# Patient Record
Sex: Female | Born: 1970 | Race: White | Hispanic: No | Marital: Single | Smoking: Never smoker
Health system: Southern US, Community
[De-identification: ages and names within clinical notes are randomized; demographics above are authoritative.]

## PROBLEM LIST (undated history)

## (undated) DIAGNOSIS — F419 Anxiety disorder, unspecified: Secondary | ICD-10-CM

## (undated) DIAGNOSIS — F4310 Post-traumatic stress disorder, unspecified: Secondary | ICD-10-CM

## (undated) DIAGNOSIS — F909 Attention-deficit hyperactivity disorder, unspecified type: Secondary | ICD-10-CM

## (undated) DIAGNOSIS — F509 Eating disorder, unspecified: Secondary | ICD-10-CM

## (undated) DIAGNOSIS — E669 Obesity, unspecified: Secondary | ICD-10-CM

## (undated) HISTORY — DX: Post-traumatic stress disorder, unspecified: F43.10

## (undated) HISTORY — DX: Eating disorder, unspecified: F50.9

## (undated) HISTORY — DX: Anxiety disorder, unspecified: F41.9

## (undated) HISTORY — DX: Attention-deficit hyperactivity disorder, unspecified type: F90.9

## (undated) HISTORY — DX: Obesity, unspecified: E66.9

## (undated) HISTORY — PX: OTHER SURGICAL HISTORY: SHX169

---
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
## 1992-06-01 HISTORY — PX: BREAST REDUCTION SURGERY: SHX8

## 1999-11-04 ENCOUNTER — Emergency Department (HOSPITAL_COMMUNITY): Admission: EM | Admit: 1999-11-04 | Discharge: 1999-11-04 | Payer: Self-pay | Admitting: Emergency Medicine

## 1999-11-04 ENCOUNTER — Encounter: Payer: Self-pay | Admitting: Emergency Medicine

## 2002-07-29 ENCOUNTER — Encounter: Payer: Self-pay | Admitting: Emergency Medicine

## 2002-07-29 ENCOUNTER — Emergency Department (HOSPITAL_COMMUNITY): Admission: EM | Admit: 2002-07-29 | Discharge: 2002-07-29 | Payer: Self-pay | Admitting: Emergency Medicine

## 2004-04-18 ENCOUNTER — Emergency Department (HOSPITAL_COMMUNITY): Admission: EM | Admit: 2004-04-18 | Discharge: 2004-04-18 | Payer: Self-pay | Admitting: Family Medicine

## 2004-06-03 ENCOUNTER — Inpatient Hospital Stay (HOSPITAL_COMMUNITY): Admission: AD | Admit: 2004-06-03 | Discharge: 2004-06-03 | Payer: Self-pay | Admitting: Family Medicine

## 2004-06-12 ENCOUNTER — Other Ambulatory Visit: Admission: RE | Admit: 2004-06-12 | Discharge: 2004-06-12 | Payer: Self-pay | Admitting: Obstetrics and Gynecology

## 2004-08-10 ENCOUNTER — Emergency Department (HOSPITAL_COMMUNITY): Admission: EM | Admit: 2004-08-10 | Discharge: 2004-08-10 | Payer: Self-pay | Admitting: Emergency Medicine

## 2006-07-07 ENCOUNTER — Other Ambulatory Visit: Admission: RE | Admit: 2006-07-07 | Discharge: 2006-07-07 | Payer: Self-pay | Admitting: Family Medicine

## 2007-11-16 ENCOUNTER — Encounter: Admission: RE | Admit: 2007-11-16 | Discharge: 2007-11-16 | Payer: Self-pay | Admitting: Family Medicine

## 2009-04-16 IMAGING — CR DG SACRUM/COCCYX 2+V
4 series · 4 of 4 positions shown · non-contrast
Comparison: None available

CLINICAL DATA: Low back pain.  Tail bone pain.

SACRUM AND COCCYX - 2+ VIEW

[view not recorded (1 of 4)]
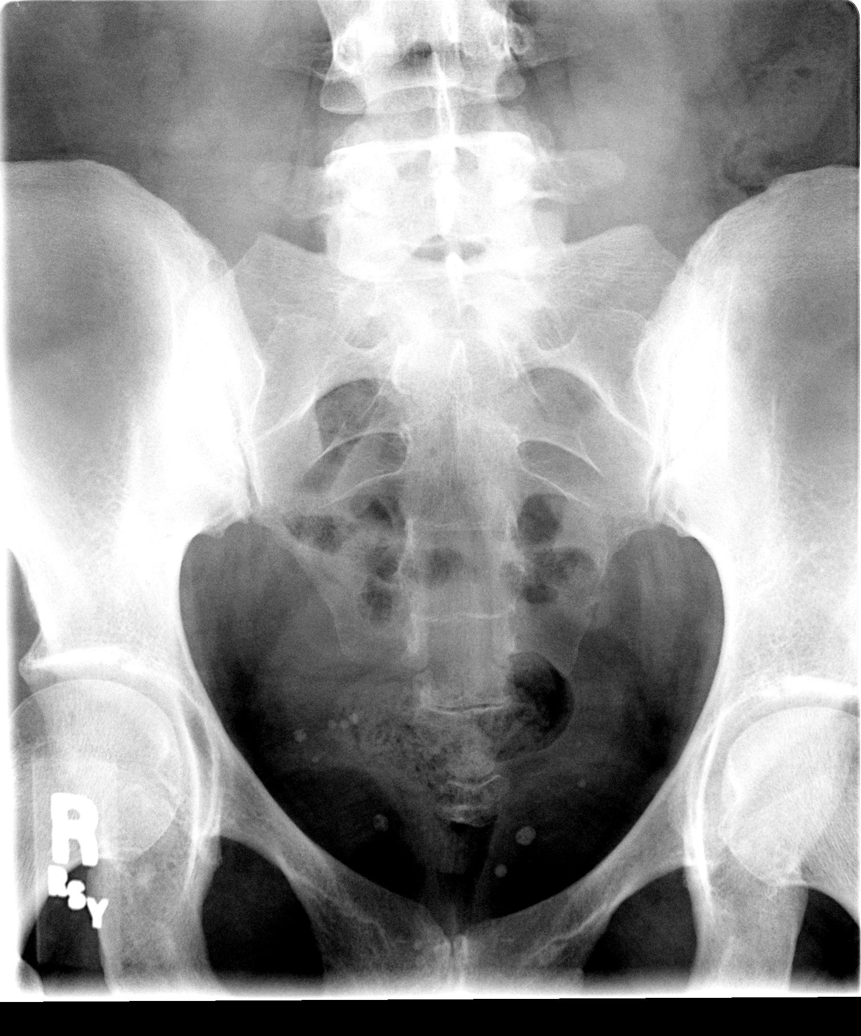

[view not recorded (2 of 4)]
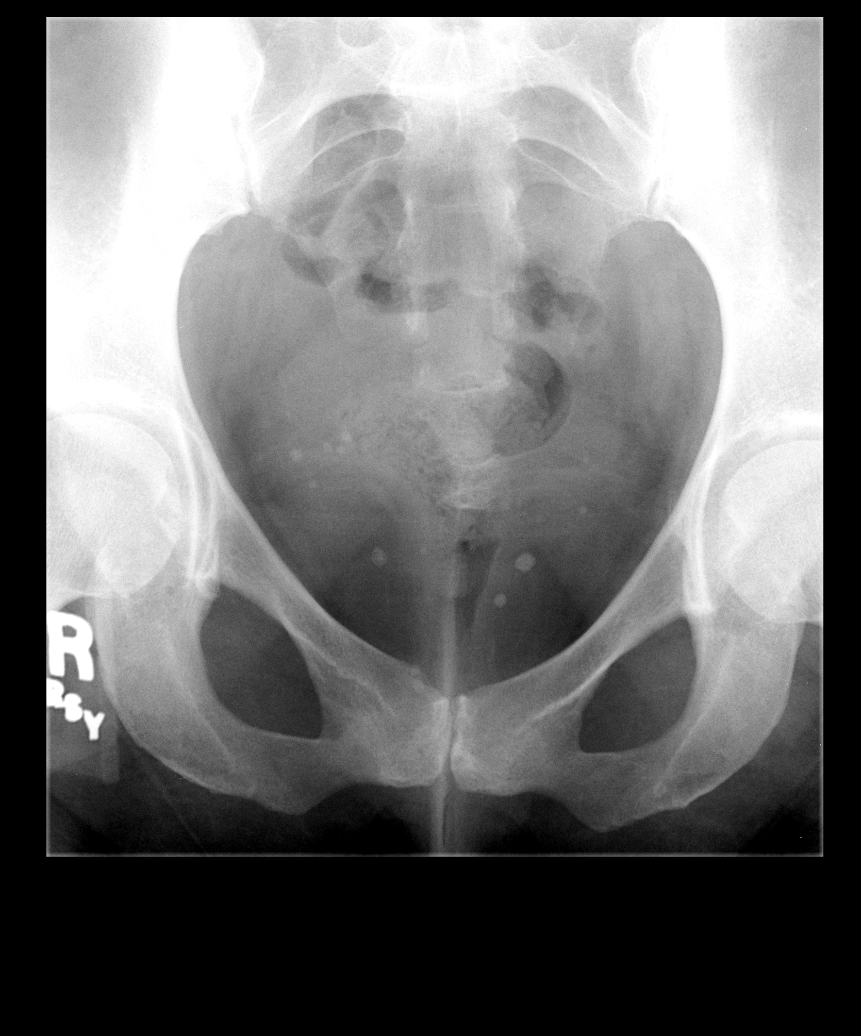

[view not recorded (3 of 4)]
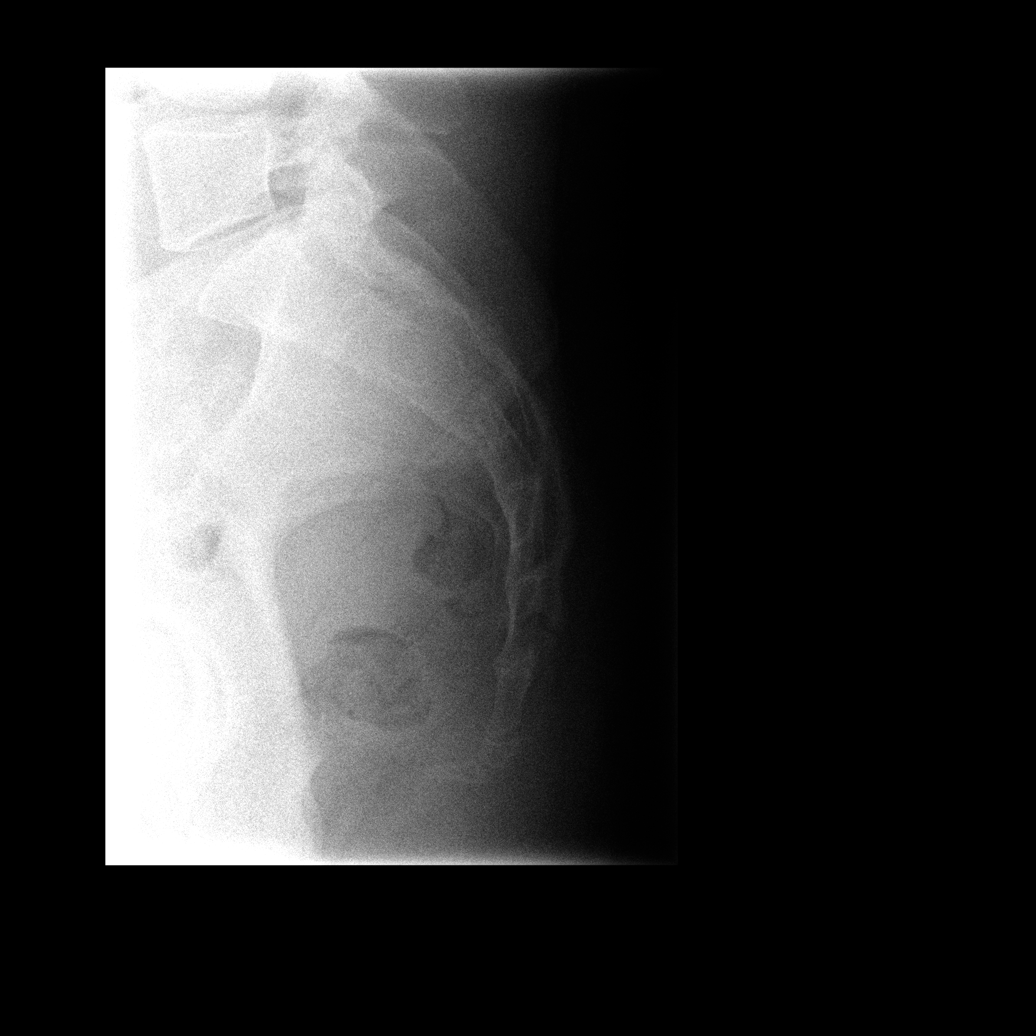

[view not recorded (4 of 4)]
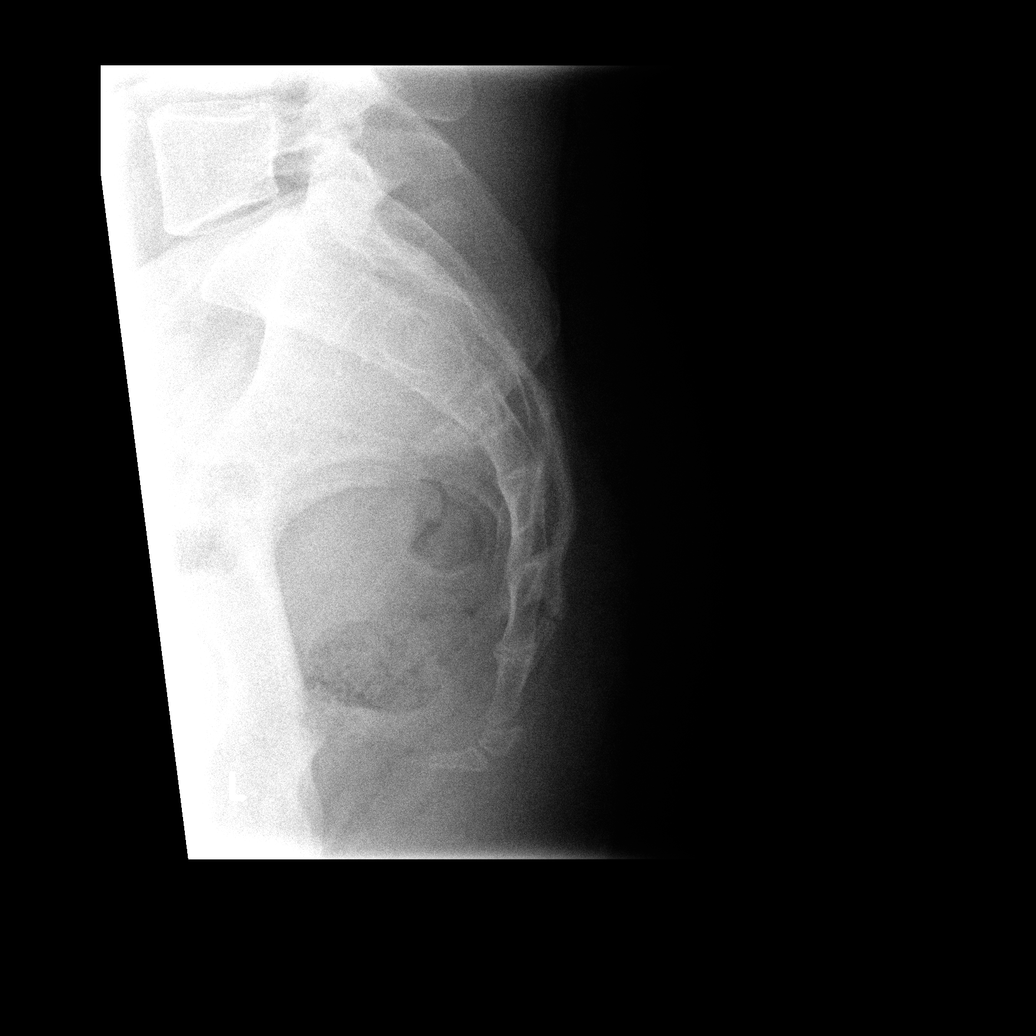

[4 of 4 positions shown; findings below may reference images not displayed]

FINDINGS: Sacrum and coccyx appear within normal limits.  No
evidence of sacral or coccygeal fracture.  Mild sclerosis of the
pubic symphysis, commonly seen in women, particularly after
childbearing.  SI joints appear within normal limits bilaterally.
IMPRESSION: Negative sacrum and coccyx radiographs.

## 2009-04-16 IMAGING — CR DG LUMBAR SPINE COMPLETE 4+V
5 series · 5 of 5 positions shown · non-contrast
Comparison: Sacrum and coccyx done same day.

CLINICAL DATA: Low back pain.  Tail bone pain.

LUMBAR SPINE - COMPLETE 4+ VIEW

[view not recorded (1 of 5)]
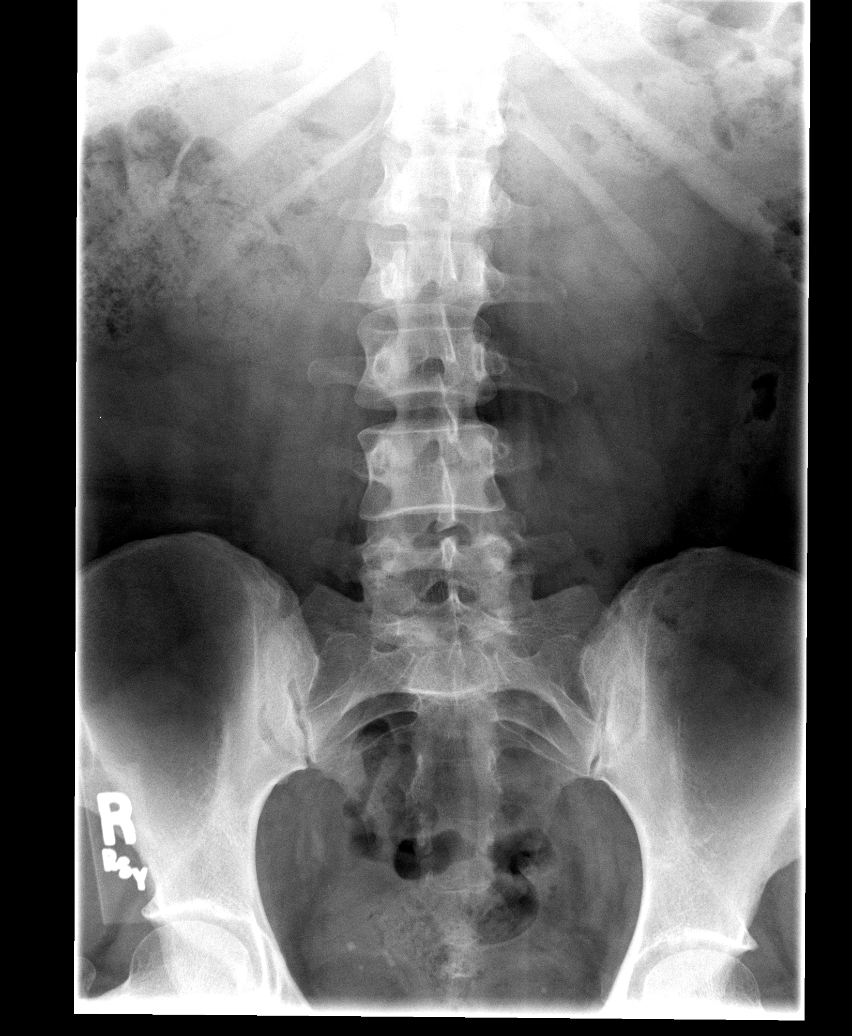

[view not recorded (2 of 5)]
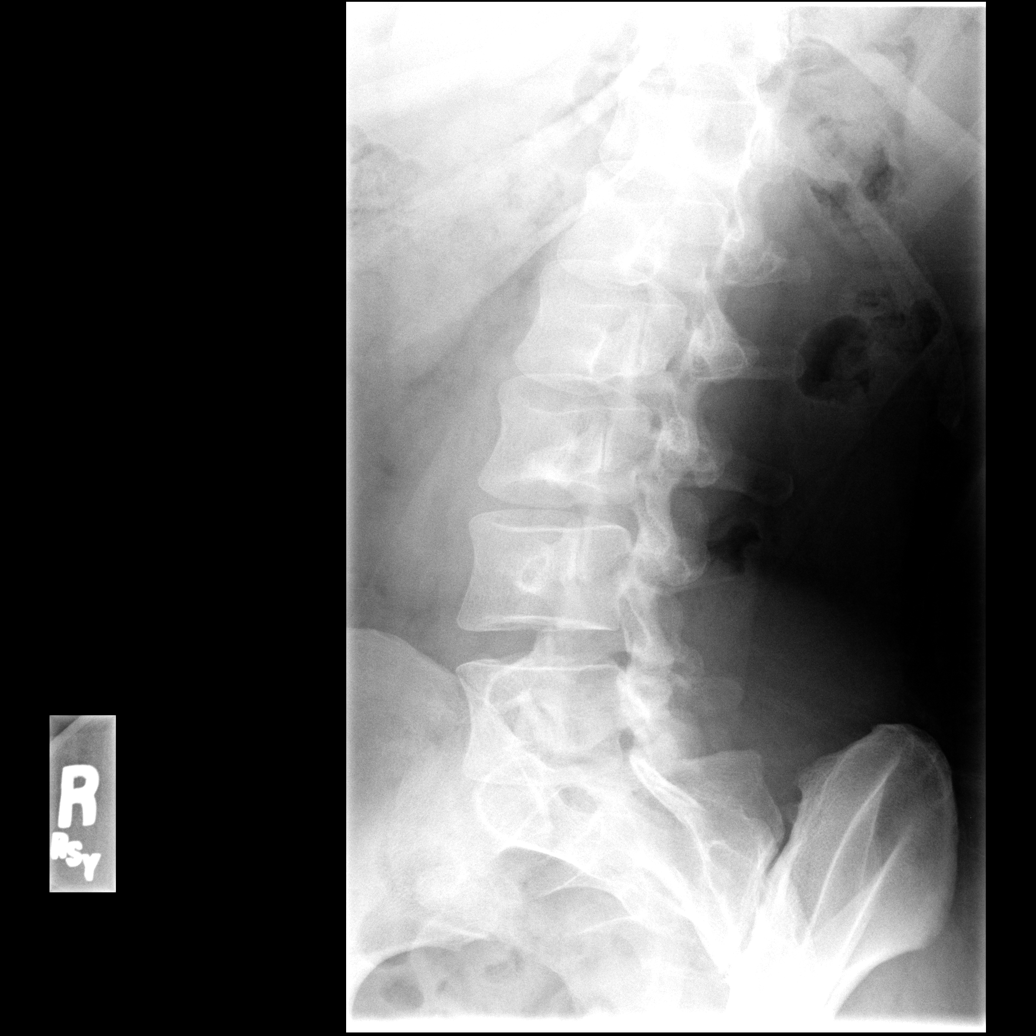

[view not recorded (3 of 5)]
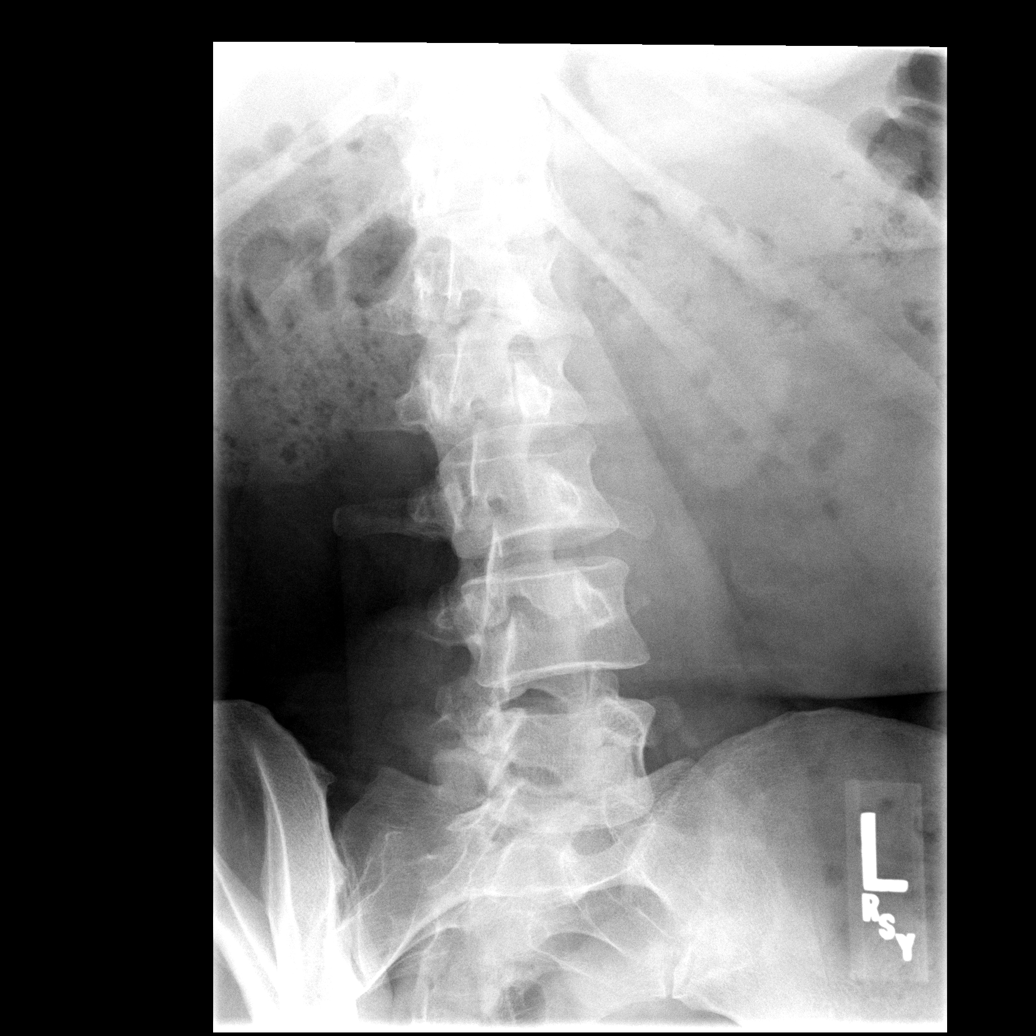

[view not recorded (4 of 5)]
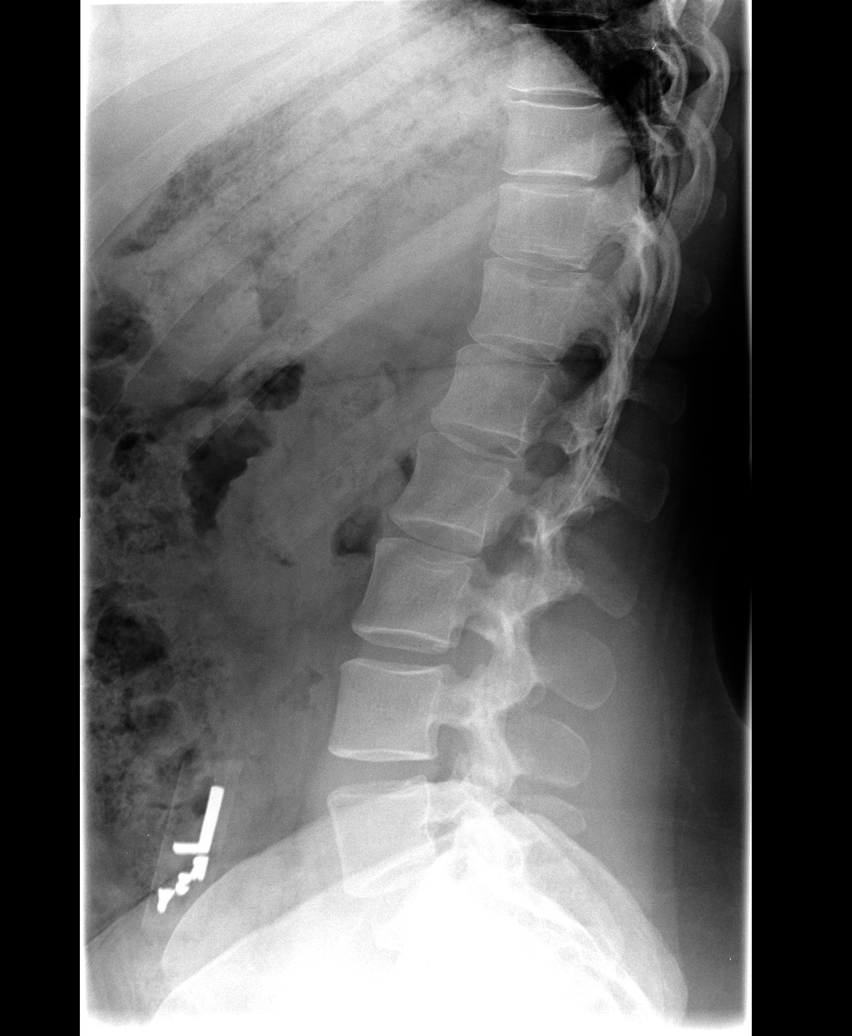

[view not recorded (5 of 5)]
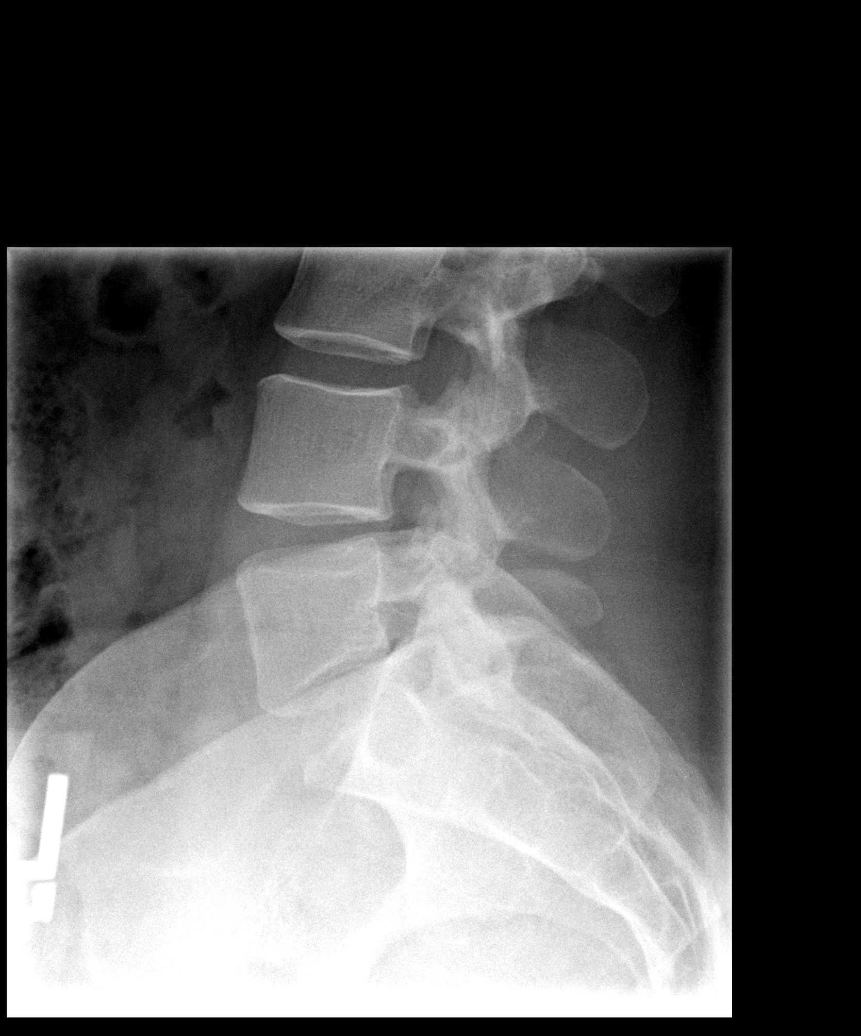

[5 of 5 positions shown; findings below may reference images not displayed]

FINDINGS: Mild dextroconvex lumbar scoliosis is present.  Calcified
phleboliths present in the anatomic pelvis.  Five lumbar type
vertebral bodies are identified.  No pars defects are present.  On
the lateral view, vertebral body heights and intervertebral disc
space preserved.
IMPRESSION: 1.  No acute abnormality of the lumbar spine.

## 2010-08-18 ENCOUNTER — Emergency Department (HOSPITAL_COMMUNITY)
Admission: EM | Admit: 2010-08-18 | Discharge: 2010-08-18 | Disposition: A | Discharge status: Home / Self Care | Payer: Medicaid Other | Attending: Emergency Medicine | Admitting: Emergency Medicine

## 2010-08-18 DIAGNOSIS — M549 Dorsalgia, unspecified: Secondary | ICD-10-CM | POA: Insufficient documentation

## 2010-08-18 DIAGNOSIS — Y929 Unspecified place or not applicable: Secondary | ICD-10-CM | POA: Insufficient documentation

## 2010-08-18 DIAGNOSIS — J45909 Unspecified asthma, uncomplicated: Secondary | ICD-10-CM | POA: Insufficient documentation

## 2011-03-27 DIAGNOSIS — E559 Vitamin D deficiency, unspecified: Secondary | ICD-10-CM | POA: Insufficient documentation

## 2012-03-03 ENCOUNTER — Telehealth (HOSPITAL_COMMUNITY): Payer: Self-pay | Admitting: *Deleted

## 2012-03-03 NOTE — Telephone Encounter (Signed)
Telephoned patient at home # and spoke with patient. Advised patient of abnormal pap smear results. Patient advised followed up with her Gyn Dr. Jacqulyn Ducking. No appointment was scheduled. Patient will call Jacquelyne Balint at [facility] and advise.

## 2015-03-14 ENCOUNTER — Telehealth (HOSPITAL_COMMUNITY): Payer: Self-pay | Admitting: *Deleted

## 2015-03-18 ENCOUNTER — Other Ambulatory Visit (HOSPITAL_COMMUNITY): Payer: Self-pay | Admitting: Family Medicine

## 2015-03-18 DIAGNOSIS — R0602 Shortness of breath: Secondary | ICD-10-CM

## 2015-03-25 ENCOUNTER — Other Ambulatory Visit (HOSPITAL_COMMUNITY): Payer: Medicaid Other

## 2015-04-22 ENCOUNTER — Other Ambulatory Visit (HOSPITAL_COMMUNITY): Payer: BLUE CROSS/BLUE SHIELD

## 2016-06-02 ENCOUNTER — Ambulatory Visit (INDEPENDENT_AMBULATORY_CARE_PROVIDER_SITE_OTHER): Payer: Self-pay | Admitting: Physician Assistant

## 2016-06-02 VITALS — BP 132/80 | HR 98 | Temp 98.4°F | Resp 18 | Ht 69.0 in | Wt 306.0 lb

## 2016-06-02 DIAGNOSIS — Z0289 Encounter for other administrative examinations: Secondary | ICD-10-CM

## 2016-06-02 NOTE — Patient Instructions (Signed)
     IF you received an x-ray today, you will receive an invoice from [facility]. Please contact [facility] at [phone] with questions or concerns regarding your invoice.   IF you received labwork today, you will receive an invoice from [facility]. Please contact [facility] at [phone] with questions or concerns regarding your invoice.   Our billing staff will not be able to assist you with questions regarding bills from these companies.  You will be contacted with the lab results as soon as they are available. The fastest way to get your results is to activate your My Chart account. Instructions are located on the last page of this paperwork. If you have not heard from us regarding the results in 2 weeks, please contact this office.     

## 2016-06-02 NOTE — Progress Notes (Signed)
[facility] [address], [place] [id number] [phone]  Date:  06/02/2016   Name:  Kiara HutchingStephanie A Andrews   DOB:  Sep 28, 1970   MRN:  [id number]  PCP:  No primary care provider on file.    History of Present Illness:  Kiara Andrews is a 46 y.o. female patient who [facility].     Going for the permit for first time. No current concerns or complaints. Ros listed below. 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  [employer]  There are no active problems to display for this patient.   History reviewed. No pertinent past medical history.  History reviewed. No pertinent surgical history.  Social History  Substance Use Topics  . Smoking status: Never Smoker  . Smokeless tobacco: Never Used  . Alcohol use No    History reviewed. No pertinent family history.  Allergies  Allergen Reactions  . Sulfa Antibiotics Anaphylaxis    Medication list has been reviewed and updated.  No current outpatient prescriptions on file prior to visit.   No current facility-administered medications on file prior to visit.     Review of Systems  Constitutional: Negative for chills and fever.  HENT: Negative for ear discharge, ear pain and sore throat.   Eyes: Negative for blurred vision and double vision.  Respiratory: Negative for cough, shortness of breath and wheezing.   Cardiovascular: Negative for chest pain, palpitations and leg swelling.  Gastrointestinal: Negative for diarrhea, nausea and vomiting.  Genitourinary: Negative for dysuria, frequency and hematuria.  Skin: Negative for itching and rash.  Neurological: Negative for dizziness and headaches.     Physical Examination: BP 132/80   Pulse 98   Temp 98.4 F (36.9 C) (Oral)   Resp 18   Ht 5\' 9"  (1.753 m)   Wt (!) 306 lb (138.8 kg)   LMP 05/13/2016   SpO2 98%   BMI 45.19 kg/m  Ideal Body Weight: Weight in (lb) to have BMI = 25: 168.9  Physical Exam  Constitutional: She is oriented  to person, place, and time. She appears well-developed and well-nourished. No distress.  HENT:  Head: Normocephalic and atraumatic.  Right Ear: Tympanic membrane, external ear and ear canal normal.  Left Ear: Tympanic membrane, external ear and ear canal normal.  Nose: Right sinus exhibits no maxillary sinus tenderness and no frontal sinus tenderness. Left sinus exhibits no maxillary sinus tenderness and no frontal sinus tenderness.  Mouth/Throat: Oropharynx is clear and moist. No uvula swelling. No oropharyngeal exudate, posterior oropharyngeal edema or posterior oropharyngeal erythema.  Eyes: Conjunctivae and EOM are normal. Pupils are equal, round, and reactive to light.  Neck: Normal range of motion. Neck supple. No thyromegaly present.  Cardiovascular: Normal rate, regular rhythm, normal heart sounds and intact distal pulses.  Exam reveals no gallop, no distant heart sounds and no friction rub.   No murmur heard. Pulmonary/Chest: Effort normal and breath sounds normal. No respiratory distress. She has no decreased breath sounds. She has no wheezes. She has no rhonchi.  Abdominal: Soft. Bowel sounds are normal. She exhibits no distension and no mass. There is no tenderness.  Musculoskeletal: Normal range of motion. She exhibits no edema or tenderness.  Lymphadenopathy:       Head (right side): No submandibular, no tonsillar, no preauricular and no posterior auricular adenopathy present.       Head (left side): No submandibular, no tonsillar, no preauricular and no posterior auricular adenopathy present.  She has no cervical adenopathy.  Neurological: She is alert and oriented to person, place, and time. No cranial nerve deficit. She exhibits normal muscle tone. Coordination normal.  Skin: Skin is warm and dry. She is not diaphoretic.  Psychiatric: She has a normal mood and affect. Her behavior is normal.     Assessment and Plan: Kiara Andrews is a 46 y.o. 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  There are no diagnoses linked to this encounter.  Trena Platt, PA-C [facility] 06/02/2016 11:46 AM

## 2017-10-20 ENCOUNTER — Other Ambulatory Visit: Payer: Self-pay | Admitting: Orthopedic Surgery

## 2017-11-04 ENCOUNTER — Ambulatory Visit (HOSPITAL_BASED_OUTPATIENT_CLINIC_OR_DEPARTMENT_OTHER): Admit: 2017-11-04 | Payer: BLUE CROSS/BLUE SHIELD | Admitting: Orthopedic Surgery

## 2017-11-04 ENCOUNTER — Encounter (HOSPITAL_BASED_OUTPATIENT_CLINIC_OR_DEPARTMENT_OTHER): Payer: Self-pay

## 2017-11-04 SURGERY — REMOVAL, HARDWARE
Anesthesia: Choice | Laterality: Bilateral

## 2020-02-22 DIAGNOSIS — E786 Lipoprotein deficiency: Secondary | ICD-10-CM | POA: Insufficient documentation

## 2020-02-22 DIAGNOSIS — E66813 Obesity, class 3: Secondary | ICD-10-CM | POA: Insufficient documentation

## 2020-06-04 DIAGNOSIS — D509 Iron deficiency anemia, unspecified: Secondary | ICD-10-CM | POA: Insufficient documentation

## 2020-08-14 DIAGNOSIS — G4733 Obstructive sleep apnea (adult) (pediatric): Secondary | ICD-10-CM | POA: Insufficient documentation

## 2020-12-09 LAB — CYTOLOGY - PAP
HPV DNA High Risk: NEGATIVE
Pap: NEGATIVE

## 2023-08-17 ENCOUNTER — Ambulatory Visit (INDEPENDENT_AMBULATORY_CARE_PROVIDER_SITE_OTHER): Admitting: Sports Medicine

## 2023-08-17 ENCOUNTER — Encounter: Payer: Self-pay | Admitting: Sports Medicine

## 2023-08-17 ENCOUNTER — Ambulatory Visit (INDEPENDENT_AMBULATORY_CARE_PROVIDER_SITE_OTHER)

## 2023-08-17 VITALS — HR 96 | Ht 69.0 in | Wt 256.0 lb

## 2023-08-17 DIAGNOSIS — G8929 Other chronic pain: Secondary | ICD-10-CM

## 2023-08-17 DIAGNOSIS — M25562 Pain in left knee: Secondary | ICD-10-CM | POA: Diagnosis not present

## 2023-08-17 DIAGNOSIS — M170 Bilateral primary osteoarthritis of knee: Secondary | ICD-10-CM

## 2023-08-17 DIAGNOSIS — M25561 Pain in right knee: Secondary | ICD-10-CM

## 2023-08-17 NOTE — Progress Notes (Signed)
 [facility] [address] [place] [address] Phone: [phone]   Assessment and Plan:     1. Chronic pain of both knees 2. Bilateral primary osteoarthritis of knee  -Chronic with exacerbation, initial sports medicine visit - Consistent with recurrent flare of bilateral knee osteoarthritis - X-rays obtained in clinic.  My interpretation: No acute fracture or dislocation.  Severe degenerative changes in the medial compartments bilaterally.  Moderate degenerative changes in bilateral patellofemoral compartments. - Patient has received relief from PRP injections in the past with most recent in December 2024, however most recent PRP injection has not been as beneficial as previous ones with patient experiencing daily pain - Start HEP and physical therapy -Patient has had negative reaction to naproxen and meloxicam in the past, so we will avoid prescription NSAIDs at this time.  Patient prefers to not use p.o. prednisone due to potential side effects - Patient elected for bilateral intra-articular CSI.  Tolerated well per note below  Procedure: Knee Joint Injection Side: Bilateral Indication: Flare of osteoarthritis  Risks explained and consent was given verbally. The site was cleaned with alcohol prep. A needle was introduced with an anterio-lateral approach. Injection given using 2mL of 1% lidocaine without epinephrine and 1mL of kenalog 40mg /ml. This was well tolerated and resulted in symptomatic relief.  Needle was removed, hemostasis achieved, and post injection instructions were explained.  Procedure was repeated on contralateral side.  Pt was advised to call or return to clinic if these symptoms worsen or fail to improve as anticipated.   15 additional minutes spent for educating Therapeutic Home Exercise Program.  This included exercises focusing on stretching, strengthening, with focus on eccentric aspects.   Long term goals  include an improvement in range of motion, strength, endurance as well as avoiding reinjury. Patient's frequency would include in 1-2 times a day, 3-5 times a week for a duration of 6-12 weeks. Proper technique shown and discussed handout in great detail with ATC.  All questions were discussed and answered.    Pertinent previous records reviewed include family medicine note 05/20/2023  Follow Up: 4 weeks for reevaluation or sooner if injections are ineffective.  Could consider Zilretta versus HA injections.  If no benefit from injection therapy, could further discuss orthopedic surgery referral.  Patient would prefer to delay surgery as long as possible and would need to decrease BMI to at least <40   Subjective:   I, Kiara Andrews, am serving as a Neurosurgeon for Doctor Richardean Sale  Chief Complaint: bilat knee pain   HPI:   08/17/23 Patient is a 53 year old female with bilat knee pain. Patient states bilat knee pain . Pain for years. Has been to PT in the past. Has done PRP in the past with good results after a year. Is in a wheel chair now. Increased pain since December PRP and stem. Wants to make sure she is taking the right steps. She is taking diuretics states those help wth swelling and pain . States she is not able to walk to get to the bathroom. Pain going up and down steps   Relevant Historical Information: Morbid obesity  Additional pertinent review of systems negative.  No current outpatient medications on file.   Objective:     Vitals:   08/17/23 1300  Pulse: 96  SpO2: 96%  Weight: (!) 306 lb (138.8 kg)  Height: 5\' 9"  (1.753 m)      Body mass index is  45.19 kg/m.    Physical Exam:    General:  awake, alert oriented, no acute distress nontoxic Skin: no suspicious lesions or rashes Neuro:sensation intact and strength 5/5 with no deficits, no atrophy, normal muscle tone Psych: No signs of anxiety, depression or other mood disorder  Bilateral knee:   swelling No  deformity Positive fluid wave, joint milking ROM Flex 90, Ext 10 Globally nonspecific TTP, worse on right  Gait antalgic.  Currently using wheelchair   Electronically signed by:  [facility]ara Andrews [facility] 2:01 PM 08/17/23

## 2023-08-17 NOTE — Patient Instructions (Signed)
 Knee HEP  PT referral  Tylenol for day to day pain relief  4 week or sooner if no pain relief follow up

## 2023-08-18 ENCOUNTER — Telehealth: Payer: Self-pay | Admitting: Sports Medicine

## 2023-08-18 NOTE — Telephone Encounter (Signed)
 Patient called to say that her weight was not correct on her paper she took home yesterday. It says 306 but her weight is 256 and she wanted to make sure that was up to date in her records.

## 2023-08-19 ENCOUNTER — Telehealth: Payer: Self-pay | Admitting: Sports Medicine

## 2023-08-19 NOTE — Telephone Encounter (Signed)
 Patient called stating that she is looking into starting a new job but this position would be standing most of the day. She asked if this is something that Dr Jean Rosenthal thinks she would be able to do? 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 She wanted to make sure that she is setting herself up for success in the long term and would like to know Dr Edison Pace opinion.  If he does not think it would be a good fit, then she will look at some other options.   She has been going to PT, swimming frequently, and doing muscle work as well. Her knees are feeling some better and she is hopful it will continue.  She would like a call back.

## 2023-08-19 NOTE — Telephone Encounter (Signed)
 Patient informed.

## 2023-08-31 ENCOUNTER — Encounter: Payer: Self-pay | Admitting: Sports Medicine

## 2023-08-31 ENCOUNTER — Ambulatory Visit: Admitting: Sports Medicine

## 2023-09-02 ENCOUNTER — Ambulatory Visit (HOSPITAL_BASED_OUTPATIENT_CLINIC_OR_DEPARTMENT_OTHER): Attending: Sports Medicine | Admitting: Physical Therapy

## 2023-09-13 NOTE — Progress Notes (Unsigned)
    Kiara Andrews D.[facility] [address] [place] [address] Phone: [phone]   Assessment and Plan:     There are no diagnoses linked to this encounter.  ***   Pertinent previous records reviewed include ***    Follow Up: ***     Subjective:   I, Kiara Andrews, am serving as a Neurosurgeon for Doctor Kiara Andrews   Chief Complaint: bilat knee pain    HPI:    08/17/23 Patient is a 53 year old female with bilat knee pain. Patient states bilat knee pain . Pain for years. Has been to PT in the past. Has done PRP in the past with good results after a year. Is in a wheel chair now. Increased pain since December PRP and stem. Wants to make sure she is taking the right steps. She is taking diuretics states those help wth swelling and pain . States she is not able to walk to get to the bathroom. Pain going up and down steps   09/14/2023 Patient states   Relevant Historical Information: Morbid obesity Additional pertinent review of systems negative.  No current outpatient medications on file.   Objective:     There were no vitals filed for this visit.    There is no height or weight on file to calculate BMI.    Physical Exam:    ***   Electronically signed by:  Kiara Andrews D.[facility] 7:34 AM 09/13/23

## 2023-09-14 ENCOUNTER — Ambulatory Visit: Admitting: Sports Medicine

## 2023-09-14 VITALS — BP 124/72 | HR 87 | Ht 69.0 in | Wt 278.0 lb

## 2023-09-14 DIAGNOSIS — G8929 Other chronic pain: Secondary | ICD-10-CM

## 2023-09-14 DIAGNOSIS — M25562 Pain in left knee: Secondary | ICD-10-CM

## 2023-09-14 DIAGNOSIS — M25561 Pain in right knee: Secondary | ICD-10-CM

## 2023-09-14 DIAGNOSIS — M170 Bilateral primary osteoarthritis of knee: Secondary | ICD-10-CM

## 2023-09-14 NOTE — Patient Instructions (Signed)
 Low back HEP Tylenol [phone] mg 2-3 times a day for pain relief  NSAIDs as needed for breakthrough pain limit 1-2 times per week  As needed follow up

## 2023-09-20 ENCOUNTER — Ambulatory Visit (HOSPITAL_BASED_OUTPATIENT_CLINIC_OR_DEPARTMENT_OTHER): Admitting: Physical Therapy

## 2023-09-21 ENCOUNTER — Telehealth: Payer: Self-pay | Admitting: Sports Medicine

## 2023-09-21 NOTE — Telephone Encounter (Signed)
 Patient called and has made an appointment on the 28th of April. She states she had an injection and it was discussed that if it did not last 3 months that she could try a different injection. She states she is having some more pain. She states she does not want to get to where is not walking again. She also states that if it is possible she would like to come at the same time as her sister as they come together.  Please advise.

## 2023-09-22 ENCOUNTER — Ambulatory Visit (INDEPENDENT_AMBULATORY_CARE_PROVIDER_SITE_OTHER): Admitting: Sports Medicine

## 2023-09-22 VITALS — HR 93 | Ht 69.0 in | Wt 278.0 lb

## 2023-09-22 DIAGNOSIS — M25561 Pain in right knee: Secondary | ICD-10-CM

## 2023-09-22 DIAGNOSIS — M25562 Pain in left knee: Secondary | ICD-10-CM

## 2023-09-22 DIAGNOSIS — E66813 Obesity, class 3: Secondary | ICD-10-CM

## 2023-09-22 DIAGNOSIS — M170 Bilateral primary osteoarthritis of knee: Secondary | ICD-10-CM

## 2023-09-22 DIAGNOSIS — E669 Obesity, unspecified: Secondary | ICD-10-CM | POA: Diagnosis not present

## 2023-09-22 DIAGNOSIS — G8929 Other chronic pain: Secondary | ICD-10-CM

## 2023-09-22 DIAGNOSIS — Z6841 Body Mass Index (BMI) 40.0 and over, adult: Secondary | ICD-10-CM

## 2023-09-22 NOTE — Patient Instructions (Signed)
 Zilretta  R knee  As needed follow up

## 2023-09-22 NOTE — Addendum Note (Signed)
 Addended by: Olita Best on: 09/22/2023 02:48 PM   Modules accepted: Orders

## 2023-09-22 NOTE — Progress Notes (Addendum)
    [facility] [address] [place] [address] Phone: [phone]   Assessment and Plan:     1. Bilateral primary osteoarthritis of knee (Primary) 2. Chronic pain of both knees -Chronic with exacerbation, subsequent visit - Recurrence of primarily right knee pain after physically active Easter weekend.  Consistent with recurrent flare of osteoarthritis that is mildly improving with relative rest. - Since patient received significant relief after bilateral knee CSI on 08/17/2023, however patient is already having recurrence of symptoms, recommend using Zilretta  for next injection.  Will obtain prior authorization today and patient can follow-up when she is ready for next injection, ideally 3 months after prior CSI - Continue HEP and online physical therapy program -Patient has had negative reaction to naproxen and meloxicam in the past so we will continue to avoid NSAIDs.  Patient prefers to not use p.o. prednisone due to side effects -Will refer to weight management to help optimize muscular strengthening and adipose tissue weight loss  Pertinent previous records reviewed include none  Follow Up: As needed when patient is ready for Zilretta  injection.  Could also follow-up further discuss lump along left hip consistent with description of lipoma   Subjective:   I, Kiara Andrews, am serving as a Neurosurgeon for Doctor Kiara Andrews   Chief Complaint: bilat knee pain    HPI:    08/17/23 Patient is a 53 year old female with bilat knee pain. Patient states bilat knee pain . Pain for years. Has been to PT in the past. Has done PRP in the past with good results after a year. Is in a wheel chair now. Increased pain since December PRP and stem. Wants to make sure she is taking the right steps. She is taking diuretics states those help wth swelling and pain . States she is not able to walk to get to the bathroom. Pain going up and down steps     09/14/2023 Patient states she is feeling better. PT is working   09/22/2023 Patient states she thinks she over did it . Right knee flare this weekend . She is feeling better today    Relevant Historical Information: Morbid obesity  Additional pertinent review of systems negative.  No current outpatient medications on file.   Objective:     Vitals:   09/22/23 1349  Pulse: 93  SpO2: 97%  Weight: 278 lb (126.1 kg)  Height: 5\' 9"  (1.753 m)      Body mass index is 41.05 kg/m.    Physical Exam:    General:  awake, alert oriented, no acute distress nontoxic Skin: no suspicious lesions or rashes Neuro:sensation intact and strength 5/5 with no deficits, no atrophy, normal muscle tone Psych: No signs of anxiety, depression or other mood disorder   Right knee: Mild swelling No deformity Positive fluid wave, joint milking ROM Flex 90, Ext 10 Globally nonspecific TTP    Gait antalgic.  With shortened steps, though patient no longer requiring wheelchair/walker.    Electronically signed by:  [facility]ara Andrews [facility] 2:13 PM 09/22/23

## 2023-09-23 ENCOUNTER — Encounter (INDEPENDENT_AMBULATORY_CARE_PROVIDER_SITE_OTHER): Payer: Self-pay

## 2023-09-27 ENCOUNTER — Ambulatory Visit: Admitting: Sports Medicine

## 2023-10-01 ENCOUNTER — Telehealth: Payer: Self-pay

## 2023-10-01 ENCOUNTER — Ambulatory Visit: Admitting: Family Medicine

## 2023-10-01 NOTE — Telephone Encounter (Signed)
 Patient had a knee injection on 08/17/23.  Please advise when patient should receive Zilretta  injection?

## 2023-10-01 NOTE — Telephone Encounter (Signed)
 Can you schedule patient when medication is stocked   Zilretta  authorized for right knee  Auth number [id number] 09/28/23-09/27/24

## 2023-10-05 NOTE — Telephone Encounter (Signed)
 Patient aware. She will call back to schedule when needed

## 2023-10-05 NOTE — Telephone Encounter (Signed)
 Left message for patient to call back to schedule.

## 2023-10-27 NOTE — Telephone Encounter (Signed)
 Pt called , struggling with knee pain and the decision about injections. Made appt for tomorrow to hold the spot.  She is concerned that doing more injections too soon will damage her joints and is having difficulty making this decision. Has not been using any meds, swims daily but knees swell and tighten when still.  Pt would like to discuss via phone. She also mentioned she should be getting injections in both knees but I only see the Zilretta  auth'd for R knee, also unsure what product she would be getting tomorrow based on the timing.

## 2023-10-27 NOTE — Telephone Encounter (Signed)
 Called and spoke with patient that Dr. Cleora Daft was willing to give CSI 20 days early

## 2023-10-27 NOTE — Telephone Encounter (Signed)
 Moenique called pt, will discuss further at OV 10/28/2023.

## 2023-10-28 ENCOUNTER — Ambulatory Visit: Admitting: Sports Medicine

## 2023-11-03 NOTE — Progress Notes (Unsigned)
    [name] [address] [place] [address] Phone: [phone]   Assessment and Plan:     There are no diagnoses linked to this encounter.  ***   Pertinent previous records reviewed include ***    Follow Up: ***     Subjective:   I, Kiara Andrews, am serving as a Neurosurgeon for Doctor Ulysees Gander   Chief Complaint: bilat knee pain    HPI:    08/17/23 Patient is a 53 year old female with bilat knee pain. Patient states bilat knee pain . Pain for years. Has been to PT in the past. Has done PRP in the past with good results after a year. Is in a wheel chair now. Increased pain since December PRP and stem. Wants to make sure she is taking the right steps. She is taking diuretics states those help wth swelling and pain . States she is not able to walk to get to the bathroom. Pain going up and down steps    09/14/2023 Patient states she is feeling better. PT is working    09/22/2023 Patient states she thinks she over did it . Right knee flare this weekend . She is feeling better today   11/04/2023 Patient states   Relevant Historical Information: Morbid obesity Additional pertinent review of systems negative.  No current outpatient medications on file.   Objective:     There were no vitals filed for this visit.    There is no height or weight on file to calculate BMI.    Physical Exam:    ***   Electronically signed by:  Marshall Skeeter D.[name] 7:42 AM 11/03/23

## 2023-11-04 ENCOUNTER — Ambulatory Visit (INDEPENDENT_AMBULATORY_CARE_PROVIDER_SITE_OTHER): Admitting: Sports Medicine

## 2023-11-04 ENCOUNTER — Telehealth: Payer: Self-pay | Admitting: Sports Medicine

## 2023-11-04 VITALS — Ht 69.0 in | Wt 278.0 lb

## 2023-11-04 DIAGNOSIS — G8929 Other chronic pain: Secondary | ICD-10-CM | POA: Diagnosis not present

## 2023-11-04 DIAGNOSIS — M25561 Pain in right knee: Secondary | ICD-10-CM | POA: Diagnosis not present

## 2023-11-04 DIAGNOSIS — M25511 Pain in right shoulder: Secondary | ICD-10-CM | POA: Diagnosis not present

## 2023-11-04 DIAGNOSIS — M170 Bilateral primary osteoarthritis of knee: Secondary | ICD-10-CM

## 2023-11-04 DIAGNOSIS — M25562 Pain in left knee: Secondary | ICD-10-CM

## 2023-11-04 MED ORDER — TRIAMCINOLONE ACETONIDE 32 MG IX SRER
32.0000 mg | Freq: Once | INTRA_ARTICULAR | Status: AC
  Administered 2023-11-04: 32 mg via INTRA_ARTICULAR

## 2023-11-04 MED ORDER — METHOCARBAMOL 500 MG PO TABS: 500.0000 mg | ORAL_TABLET | Freq: Three times a day (TID) | ORAL | 0 refills | Status: DC

## 2023-11-04 NOTE — Telephone Encounter (Signed)
 Patient called insurance to see if we could get both knees approved, [employer] is on the phone and stating that both knees are approved. With the case number stated in message below. I made sure to ask multiple times that patient is good to get both knees, they put me on hold to check and came back stating yes it was sent in with the code for both knees so it is approved for both knees.

## 2023-11-04 NOTE — Telephone Encounter (Signed)
 Closing encounter still need L zilretta 

## 2023-11-04 NOTE — Patient Instructions (Addendum)
 L knee zilretta  Robaxin 500 mg 3x daily as needed Zilretta  given in bilateral knee today

## 2023-11-04 NOTE — Telephone Encounter (Signed)
 Case number [id number]

## 2023-11-08 ENCOUNTER — Ambulatory Visit: Admitting: Sports Medicine

## 2023-11-23 NOTE — Progress Notes (Deleted)
    Ben Jackson D.[facility] [address] [place] [address] Phone: [phone]   Assessment and Plan:     There are no diagnoses linked to this encounter.  ***   Pertinent previous records reviewed include ***    Follow Up: ***     Subjective:   I, Nevaeh Korte, am serving as a Neurosurgeon for Doctor Morene Mace   Chief Complaint: bilat knee pain    HPI:    08/17/23 Patient is a 53 year old female with bilat knee pain. Patient states bilat knee pain . Pain for years. Has been to PT in the past. Has done PRP in the past with good results after a year. Is in a wheel chair now. Increased pain since December PRP and stem. Wants to make sure she is taking the right steps. She is taking diuretics states those help wth swelling and pain . States she is not able to walk to get to the bathroom. Pain going up and down steps    09/14/2023 Patient states she is feeling better. PT is working    09/22/2023 Patient states she thinks she over did it . Right knee flare this weekend . She is feeling better today    11/04/2023 Patient states here for shots today. States she has right arm pain from overuse   12/02/2023 Patient states   Relevant Historical Information: Morbid obesity  Additional pertinent review of systems negative.   Current Outpatient Medications:    methocarbamol  (ROBAXIN ) 500 MG tablet, Take 1 tablet (500 mg total) by mouth 3 (three) times daily., Disp: 90 tablet, Rfl: 0   Objective:     There were no vitals filed for this visit.    There is no height or weight on file to calculate BMI.    Physical Exam:    ***   Electronically signed by:  Odis Mace D.[facility] 8:49 AM 11/23/23

## 2023-12-01 ENCOUNTER — Other Ambulatory Visit: Payer: Self-pay | Admitting: Sports Medicine

## 2023-12-02 ENCOUNTER — Ambulatory Visit: Admitting: Sports Medicine

## 2024-02-15 DIAGNOSIS — F411 Generalized anxiety disorder: Secondary | ICD-10-CM | POA: Insufficient documentation

## 2024-02-15 DIAGNOSIS — M47816 Spondylosis without myelopathy or radiculopathy, lumbar region: Secondary | ICD-10-CM | POA: Insufficient documentation

## 2024-02-18 ENCOUNTER — Telehealth: Payer: Self-pay | Admitting: Sports Medicine

## 2024-02-18 NOTE — Telephone Encounter (Signed)
 Patient called asking if authorization could be done for repeat Zilretta . She would like bilateral. She now has new insurance [employer]) and it is in the system.  Can you check authorization?  Patient would also like a 30 minute appointment to discuss some hand issues as well. Dr Joane?

## 2024-02-21 NOTE — Telephone Encounter (Signed)
 Patient ran for Zilretta  for left knee on 02/21/24. Case ID: [id number]. Pending approval.

## 2024-02-23 NOTE — Telephone Encounter (Signed)
 Scheduled

## 2024-02-23 NOTE — Telephone Encounter (Signed)
 Can you please schedule patient when medication is stocked   Zilretta  authorized for left knee NO PRE CERT REQUIRED  No copay, deductible or OOP that applies to these services  Patient would have to call insurance for allowable/reimbursement information. [id number] and [id number] covered at 100% Reference # [id number] [id number] 02/21/2024

## 2024-02-29 ENCOUNTER — Other Ambulatory Visit: Payer: Self-pay

## 2024-02-29 ENCOUNTER — Telehealth: Payer: Self-pay | Admitting: Family Medicine

## 2024-02-29 ENCOUNTER — Ambulatory Visit (INDEPENDENT_AMBULATORY_CARE_PROVIDER_SITE_OTHER): Admitting: Family Medicine

## 2024-02-29 VITALS — BP 128/84 | HR 84 | Ht 69.0 in

## 2024-02-29 DIAGNOSIS — M25561 Pain in right knee: Secondary | ICD-10-CM | POA: Diagnosis not present

## 2024-02-29 DIAGNOSIS — G8929 Other chronic pain: Secondary | ICD-10-CM | POA: Diagnosis not present

## 2024-02-29 DIAGNOSIS — M25562 Pain in left knee: Secondary | ICD-10-CM | POA: Diagnosis not present

## 2024-02-29 DIAGNOSIS — M25469 Effusion, unspecified knee: Secondary | ICD-10-CM | POA: Diagnosis not present

## 2024-02-29 DIAGNOSIS — M2550 Pain in unspecified joint: Secondary | ICD-10-CM | POA: Diagnosis not present

## 2024-02-29 DIAGNOSIS — M170 Bilateral primary osteoarthritis of knee: Secondary | ICD-10-CM | POA: Diagnosis not present

## 2024-02-29 LAB — URIC ACID: Uric Acid, Serum: 8.6 mg/dL — ABNORMAL HIGH (ref 2.4–7.0)

## 2024-02-29 LAB — SEDIMENTATION RATE: Sed Rate: 30 mm/h (ref 0–30)

## 2024-02-29 MED ORDER — TRIAMCINOLONE ACETONIDE 32 MG IX SRER
32.0000 mg | Freq: Once | INTRA_ARTICULAR | Status: AC
  Administered 2024-02-29: 32 mg via INTRA_ARTICULAR

## 2024-02-29 NOTE — Telephone Encounter (Signed)
 Pt had Zilretta  injections today, OK to run benefits for gel shots?

## 2024-02-29 NOTE — Telephone Encounter (Addendum)
 Patient called and would like to know how long after injections she has to wait before she can go swimming. Please advise.

## 2024-02-29 NOTE — Progress Notes (Unsigned)
 LILLETTE Ileana Collet, PhD, LAT, ATC acting as a scribe for Artist Lloyd, MD.  Kiara Andrews is a 53 y.o. female who presents to [facility] at [facility] today for exacerbation of her bilat knee pain. Pt was last seen by Dr. Leonce on 11/04/23 and both knees were injected w/ Zilretta .  Today, pt reports bilat knee pain returned over the last 2-3wks, R>L. She notes swelling in the R knee. She also notes some pain in her L ankle.   She also c/o bilat UE swelling limited mobility after receiving MMR and HepB boosters, Aug 8th. Pt locates areas to bilat wrists, hands, and fingers.  Dx imaging: 08/17/23 R & L knee XR  Pertinent review of systems: No fevers or chills  Relevant historical information: Sleep apnea and obesity.   Exam:  BP 128/84   Pulse 84   Ht 5' 9 (1.753 m)   SpO2 97%   BMI 41.05 kg/m  General: Well Developed, well nourished, and in no acute distress.   MSK: Wrist bilaterally significant effusion.  Decreased range of motion.  Mild synovitis is present across MCPs as well.  Knees bilaterally moderate effusion decreased range of motion.    Lab and Radiology Results   Zilretta  injection bilateral knee Procedure: Real-time Ultrasound Guided Injection of right knee joint superior lateral patellar space Device: Philips Affiniti 50G Images permanently stored and available for review in PACS Verbal informed consent obtained.  Discussed risks and benefits of procedure. Warned about infection, hyperglycemia bleeding, damage to structures among others. Patient expresses understanding and agreement Time-out conducted.   Noted no overlying erythema, induration, or other signs of local infection.   Skin prepped in a sterile fashion.   Local anesthesia: Topical Ethyl chloride.   With sterile technique and under real time ultrasound guidance: Zilretta  32 mg injected into knee joint. Fluid seen entering the joint capsule.   Completed without difficulty    Advised to call if fevers/chills, erythema, induration, drainage, or persistent bleeding.   Images permanently stored and available for review in the ultrasound unit.  Impression: Technically successful ultrasound guided injection.   Procedure: Real-time Ultrasound Guided Injection of left knee joint superior lateral patellar space Device: Philips Affiniti 50G Images permanently stored and available for review in PACS Verbal informed consent obtained.  Discussed risks and benefits of procedure. Warned about infection, hyperglycemia bleeding, damage to structures among others. Patient expresses understanding and agreement Time-out conducted.   Noted no overlying erythema, induration, or other signs of local infection.   Skin prepped in a sterile fashion.   Local anesthesia: Topical Ethyl chloride.   With sterile technique and under real time ultrasound guidance: Zilretta  32 mg injected into knee joint. Fluid seen entering the joint capsule.   Completed without difficulty   Advised to call if fevers/chills, erythema, induration, drainage, or persistent bleeding.   Images permanently stored and available for review in the ultrasound unit.  Impression: Technically successful ultrasound guided injection.  Lot number: [id number]     Assessment and Plan: 53 y.o. female with bilateral knee pain due to exacerbation of DJD.  Plan for repeat Zilretta  injections today.  Patient has polyarthralgia and was significantly on exam today significant wrist effusion.  She does have a scheduled appointment to see a rheumatologist for the first time in about 3 weeks.  Will go ahead and get initial rheumatologic labs as that should help rheumatology in the future.   PDMP not reviewed this encounter. Orders Placed This Encounter  Procedures   US  LIMITED JOINT SPACE STRUCTURES LOW BILAT(NO LINKED CHARGES)    Reason for Exam (SYMPTOM  OR DIAGNOSIS REQUIRED):   bilateral knee pain    Preferred imaging  location?:   [facility]   ANA    Standing Status:   Future    Number of Occurrences:   1    Expiration Date:   02/28/2025   Cyclic citrul peptide antibody, IgG    Standing Status:   Future    Number of Occurrences:   1    Expiration Date:   02/28/2025   HLA-B27 antigen    Polyarthalgia    Standing Status:   Future    Number of Occurrences:   1    Expiration Date:   02/28/2025   Rheumatoid factor    Standing Status:   Future    Number of Occurrences:   1    Expiration Date:   02/28/2025   Sedimentation rate    Standing Status:   Future    Number of Occurrences:   1    Expiration Date:   02/28/2025   Uric acid    Standing Status:   Future    Number of Occurrences:   1    Expiration Date:   02/28/2025   Meds ordered this encounter  Medications   Triamcinolone  Acetonide (ZILRETTA ) intra-articular injection 32 mg   Triamcinolone  Acetonide (ZILRETTA ) intra-articular injection 32 mg     Discussed warning signs or symptoms. Please see discharge instructions. Patient expresses understanding.   The above documentation has been reviewed and is accurate and complete Artist Lloyd, M.D.

## 2024-02-29 NOTE — Telephone Encounter (Signed)
 If Dr. Joane agrees, patient would like to run benefits for bil knee gel/HA injections.

## 2024-02-29 NOTE — Patient Instructions (Addendum)
 Thank you for coming in today.   You received an injection today. Seek immediate medical attention if the joint becomes red, extremely painful, or is oozing fluid.   Please get labs today before you leave   I can repeat the Zilretta  injection in 3 months, if needed

## 2024-03-02 ENCOUNTER — Encounter: Payer: Self-pay | Admitting: Family Medicine

## 2024-03-02 LAB — ANTI-NUCLEAR AB-TITER (ANA TITER)
ANA TITER: 1:80 {titer} — ABNORMAL HIGH
ANA Titer 1: 1:160 {titer} — ABNORMAL HIGH

## 2024-03-02 LAB — HLA-B27 ANTIGEN: HLA-B27 Antigen: NEGATIVE

## 2024-03-02 LAB — CYCLIC CITRUL PEPTIDE ANTIBODY, IGG: Cyclic Citrullin Peptide Ab: >250 U — ABNORMAL HIGH

## 2024-03-02 LAB — ANA: Anti Nuclear Antibody (ANA): POSITIVE — AB

## 2024-03-02 LAB — RHEUMATOID FACTOR: Rheumatoid fact SerPl-aCnc: 97 [IU]/mL — ABNORMAL HIGH (ref <14)

## 2024-03-02 NOTE — Telephone Encounter (Signed)
 Forwarding to Dr. Denyse Amass to review and advise.

## 2024-03-02 NOTE — Telephone Encounter (Signed)
 Patient ran for Orthovisc for bilateral knees on 03/02/24. Case [id number]. Pending approval.

## 2024-03-02 NOTE — Telephone Encounter (Signed)
 Okay to run gel shots

## 2024-03-03 ENCOUNTER — Ambulatory Visit: Payer: Self-pay | Admitting: Family Medicine

## 2024-03-03 MED ORDER — COLCHICINE 0.6 MG PO TABS: 0.6000 mg | ORAL_TABLET | Freq: Every day | ORAL | 2 refills | Status: DC | PRN

## 2024-03-03 MED ORDER — ALLOPURINOL 300 MG PO TABS: 300.0000 mg | ORAL_TABLET | Freq: Every day | ORAL | 1 refills | Status: DC

## 2024-03-03 NOTE — Progress Notes (Signed)
 Rheumatology labs are significantly elevated indicating that you almost certainly have a rheumatologic problem.  Thankfully you are already scheduled to see rheumatologist soon! Additionally uric acid is elevated indicating gout is definitely a possibility.  I have prescribed colchicine and allopurinol.  Please take colchicine daily for about 2 months.  This stops your body from reacting to uric acid crystals and it works quickly.  After too much you can take it just as needed for gout attacks. Allopurinol should be taken daily and reduce his uric acid level. Recommend rechecking with me in about 1 month.

## 2024-03-08 ENCOUNTER — Encounter: Payer: Self-pay | Admitting: Family Medicine

## 2024-03-08 ENCOUNTER — Other Ambulatory Visit: Payer: Self-pay

## 2024-03-08 ENCOUNTER — Ambulatory Visit: Admitting: Family Medicine

## 2024-03-08 VITALS — BP 122/86 | HR 77 | Ht 69.0 in

## 2024-03-08 DIAGNOSIS — M25561 Pain in right knee: Secondary | ICD-10-CM | POA: Diagnosis not present

## 2024-03-08 DIAGNOSIS — M25532 Pain in left wrist: Secondary | ICD-10-CM

## 2024-03-08 DIAGNOSIS — M2550 Pain in unspecified joint: Secondary | ICD-10-CM

## 2024-03-08 DIAGNOSIS — M25562 Pain in left knee: Secondary | ICD-10-CM

## 2024-03-08 DIAGNOSIS — M25531 Pain in right wrist: Secondary | ICD-10-CM

## 2024-03-08 DIAGNOSIS — G8929 Other chronic pain: Secondary | ICD-10-CM

## 2024-03-08 NOTE — Patient Instructions (Addendum)
 Thank you for coming in today.   Let me know how it goes with rheumatology.

## 2024-03-08 NOTE — Progress Notes (Signed)
 I, Leotis Batter, CMA acting as a scribe for Artist Lloyd, MD.  Kiara Andrews is a 53 y.o. female who presents to [facility] at [facility] today for bilat wrist pain. Pt was previously seen by Dr. Lloyd on 02/29/24 for bilat knee pain and polyarthralgia.  Today, pt c/o bilat wrist pain x 2 months. Pt locates pain to bilat wrists, hands, and thumbs. Swelling present bilat, has improved with plant based diet. Denies n/t. Notes decreased grip strength.   Radiates: wrists, hands, fingers Paresthesia: denies Grip strength: decreased Aggravates: processed foods Treatments tried: has been prescribed meds for gout, IBU 800, muscle relaxer  Dx testing: 02/29/24 Labs  Pertinent review of systems: No fevers or chills  Relevant historical information: Elevated uric acid.  Obesity.   Exam:  BP 122/86   Pulse 77   Ht 5' 9 (1.753 m)   SpO2 96%   BMI 41.05 kg/m  General: Well Developed, well nourished, and in no acute distress.   MSK: Wrist bilaterally mild effusion.    Lab and Radiology Results  Recent Results (from the past 2160 hours)  Uric acid     Status: Abnormal   Collection Time: 02/29/24  3:16 PM  Result Value Ref Range   Uric Acid, Serum 8.6 (H) 2.4 - 7.0 mg/dL  Sedimentation rate     Status: None   Collection Time: 02/29/24  3:16 PM  Result Value Ref Range   Sed Rate 30 0 - 30 mm/hr  Rheumatoid factor     Status: Abnormal   Collection Time: 02/29/24  3:16 PM  Result Value Ref Range   Rheumatoid fact SerPl-aCnc 97 (H) <14 IU/mL  HLA-B27 antigen     Status: None   Collection Time: 02/29/24  3:16 PM  Result Value Ref Range   HLA-B27 Antigen NEGATIVE NEGATIVE  Cyclic citrul peptide antibody, IgG     Status: Abnormal   Collection Time: 02/29/24  3:16 PM  Result Value Ref Range   Cyclic Citrullin Peptide Ab >749 (H) UNITS    Comment: Reference Range Negative:            <20 Weak Positive:       20-39 Moderate Positive:   40-59 Strong  Positive:     >59 .   ANA     Status: Abnormal   Collection Time: 02/29/24  3:16 PM  Result Value Ref Range   Anti Nuclear Antibody (ANA) POSITIVE (A) NEGATIVE    Comment: ANA IFA is a first line screen for detecting the presence of up to approximately 150 autoantibodies in various autoimmune diseases. A positive ANA IFA result is suggestive of autoimmune disease and reflexes to titer and pattern. Further laboratory testing may be considered if clinically indicated. . For additional information, please refer to http://education.[facility].com/faq/FAQ177 (This link is being provided for informational/ educational purposes only.) .   Anti-nuclear ab-titer (ANA titer)     Status: Abnormal   Collection Time: 02/29/24  3:16 PM  Result Value Ref Range   ANA Titer 1 1:160 (H) titer    Comment:                 Reference Range                 <1:40        Negative                 1:40-1:80    Low Antibody Level                 >  1:80        Elevated Antibody Level .    ANA Pattern 1 Nuclear, Homogeneous (A)     Comment: Homogeneous pattern is associated with systemic lupus erythematosus (SLE), drug-induced lupus and juvenile idiopathic arthritis. . AC-1: Homogeneous . International Consensus on ANA Patterns ([email or web address])    ANA TITER 1:80 (H) titer    Comment: A low level ANA titer may be present in pre-clinical autoimmune diseases and normal individuals.                 Reference Range                 <1:40        Negative                 1:40-1:80    Low Antibody Level                 >1:80        Elevated Antibody Level .    ANA PATTERN Nuclear, Speckled (A)     Comment: Speckled pattern is associated with mixed connective tissue disease (MCTD), systemic lupus erythematosus (SLE), Sjogren's syndrome, dermatomyositis, and  systemic sclerosis/polymyositis overlap. . AC-2,4,5,29: Speckled . International Consensus on ANA  Patterns ([email or web address])        Assessment and Plan: 53 y.o. female with bilateral wrist and bilateral knee pain.  Patient is feeling overall much improved after Zilretta  steroid injections in her knees last week.  She probably is experiencing some systemic benefit of the steroids.  Wrist are not painful enough at this time that injection makes sense per patient and I agree with that decision.  She did have a rheumatologic workup which was significantly positive with elevated rheumatoid factor and CCP.  She had a mildly elevated ANA titer and significantly elevated uric acid.  She is scheduled to see a rheumatologist the next week or 2.  I think it is okay to wait on any treatment decisions until she sees rheumatology.  I did prescribe allopurinol and colchicine but she has not started them yet.  Okay to wait till she sees rheumatology.   PDMP not reviewed this encounter. No orders of the defined types were placed in this encounter.  No orders of the defined types were placed in this encounter.    Discussed warning signs or symptoms. Please see discharge instructions. Patient expresses understanding.   The above documentation has been reviewed and is accurate and complete Artist Lloyd, M.D.

## 2024-03-20 NOTE — Telephone Encounter (Signed)
 Patient needs an appointment once medication is stocked.   Orthovisc approved for bilateral knees.  Deductible/ OOP does not apply. The patient is responsible for a copay. Only one copay applies per date of service. Prior authorization is not required.   Ref: Crystal A 03/15/24 Exp: 09/13/2024

## 2024-03-21 NOTE — Telephone Encounter (Signed)
 Per last OV note, waiting on any treatment options until after patient sees rheumatology.   Holding until needed.

## 2024-05-02 NOTE — Progress Notes (Signed)
 SUBJECTIVE   Patient ID: Kiara Andrews is a 53 y.o. (DOB 1971-05-06) female  Chief Complaint  Patient presents with  . Follow-up    Location Information: Patient State (at time of visit): [place]  Patient Location (at time of visit):Home/Other Non-Medical  Provider Location: Home Is provider licensed to provide clinical care in the current location/state of the patient? Yes   Consent:  Patient's identity was confirmed. Presenting condition or illness was discussed with the patient/personal representative. Current proposed treatment for presenting condition or illness was explained to patient/personal representative along with the likely benefits and any significant risks or complications associated with the provision of treatment by audio/video means. The patient/personal representative verbally authorized treatment to be provided by audio/video, which may include a limited review of patient's current health status, medication, or other treatment recommendations, patient education, and an opportunity to ask questions about condition and treatment. Verbal Consent Granted by Patient/Personal Representative:Yes   Visit Information: Modality: 2-Way Real-Time Audio/Video   Video Total Time: 15 mins   History of Present Illness  53 year old female continues to suffer from degenerative changes, deconditioning, and chronic pain.  Review of chart reveals that she has been compliant with going to physical therapy, Occupational Therapy, and pain management.  She has also established care with rheumatology, as directed.  She has an upcoming appointment with endocrinology to help with insulin resistance.  She feels hopeful that the physical therapy plan will provide her some relief of symptoms.  Her goal is to continue conservative measures for pain management instead of medication therapy.  She has been attending group counseling sessions.  Lastly, she would like additional referrals to  gynecology to discuss hormone replacement therapy and perimenopause.  She would also like a referral to an allergy specialist for what she believes may be mast cell activation syndrome.   OBJECTIVE   Allergies[1]  Current Medications[2]  Problem List[3]  Medical History[4]    Surgical History[5]  Family History[6]  There were no vitals taken for this visit.  Review of Systems Systemic: Feeling fine.  No fever  and no chills. Head: No headache. Eyes: No vision problems. Cardiovascular: No chest pain or discomfort. No palpitations Pulmonary: No dyspnea. No cough. Gastrointestinal: No abdominal pain. No nausea, vomiting, or diarrhea. Psychological: endorses anxiety and/or depression. All other symptoms are reviewed and are negative.  Physical Exam Constitutional: Oriented to person, place and time. Appears well-developed and well nourished. Psychiatric: Normal mood and affect. Behavior normal. Judgement and thought content normal.    ASSESSMENT/PLAN   1. 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  Patient is continue physical therapy and Occupational Therapy as well as continue workup with all specialists.  She is to follow-up as needed.   Orders Placed This Encounter  Procedures  . Ambulatory referral to Gynecology  . Ambulatory referral to Allergy    Requested Prescriptions    No prescriptions requested or ordered in this encounter    Risks, benefits, and alternatives of the medication(s) and treatment plan(s) were discussed, and she expressed understanding. Plan follow up as discussed or as needed. No barriers to treatment identified in this visit.            [1] Allergies Allergen Reactions  . Sulfa (Sulfonamide Antibiotics) Anaphylaxis, Rash and Swelling  . Pollen Extracts Other (See  Comments)  [2]  Current Outpatient Medications  Medication Sig Dispense Refill  . acetylcysteine (NAC ORAL) Take by mouth.    . benzalkonium chloride 0.13 % towl     . CHLORELLA ALGAE ORAL Take by mouth.    . cholecalciferol (VITAMIN D3) 1,000 unit (25 mcg) tablet Take by mouth.    . clobetasoL (TEMOVATE) 0.05 % ointment Apply topically 2 (two) times a day. For a fungal infection. 60 g 0  . CYANOCOBALAMIN, VITAMIN B-12, ORAL Take 1 tablet by mouth.    SABRA ibuprofen (MOTRIN) 800 mg tablet Take 1 tablet (800 mg total) by mouth every 8 (eight) hours as needed for mild pain (1-3). 90 tablet 1  . IODINE ORAL Take by mouth.    . methocarbamoL  (ROBAXIN ) 750 mg tablet Take 1 tablet (750 mg total) by mouth 3 (three) times a day as needed for muscle spasms. 90 tablet 2  . multivit with minerals/lutein (MULTIVITAMIN 50 PLUS ORAL) Take 1 tablet by mouth daily.    . mv,cal,iron,mn/folic acid/chol (HAIR-SKIN-NAILS, PABA, ORAL) Take by mouth.    . tirzepatide, weight loss, (Zepbound) 2.5 mg/0.5 mL subcutaneous pen injector Inject 0.5 mL (2.5 mg total) under the skin every 7 days. 2 mL 0  . UNABLE TO FIND Med Name: [place]ia    . UNABLE TO FIND Med Name: [employer]     No current facility-administered medications for this visit.  [3] Patient Active Problem List Diagnosis  . Changing skin lesion  . Obesity  . Iron deficiency anemia  . Low HDL (under 40)  . OSA (obstructive sleep apnea)  . Seasonal allergic rhinitis  . Vitamin D deficiency  . Unsteady gait  . Polyarthralgia  . Lumbar spondylosis  . Other idiopathic scoliosis, lumbar region  . Extreme binge-eating disorder  . GAD (generalized anxiety disorder)  . Social anxiety disorder  [4] Past Medical History: Diagnosis Date  . ADHD   . Allergic Child   Dust  . Allergic rhinitis Child   Dust  . Anxiety   . Arthritis    In most joints and this is stopping me from moving and walking and working  . Asthma (CMD)    Allergy onset  . Autism  spectrum disorder (CMD)   . Binge eating disorder   . Depression   . Difficulty walking   . Eczema    As a child always had sensitive skin prone to rashes  . Hypertension   . Movement disorder    I have low mobility right now  . Obesity 30   Imcreased a lot lately  . OCD (obsessive compulsive disorder)   . Sleep apnea ?   New sleep study done on 9.30.25  [5] Past Surgical History: Procedure Laterality Date  . BUNIONECTOMY    . REDUCTION MAMMAPLASTY    . TONSILLECTOMY  Age 86   Kissing tonsils, high frequency of strep throat as a child all the way up to age 3  . TONSILLECTOMY AND ADENOIDECTOMY    [6] Family History Problem Relation Name Age of Onset  . Uterine cancer Mother    . Depression Mother  31 - 53  . Diabetes Father Elsie Austell   . Heart disease Father Elsie Willmott   . Neuropathy Father Elsie Romito   . Stroke Father Elsie Hazelrigg   . Arthritis Father Elsie Daffern   . Heart attack Father Elsie Tomko        Had to have a stent placement.  . Prostatitis Father Elsie Kallstrom   . Hypertension  Father Elsie Jorge   . Gout Father Elsie Fettig   . Fibromyalgia Maternal Grandmother [employer]   . Arthritis Maternal Grandmother Luella Hubbard   . Diabetes Maternal Grandmother [employer]   . Heart failure Maternal Grandmother Luella Hubbard   . Carpal tunnel syndrome Maternal Grandmother Luella Hubbard   . Valvular heart disease Maternal Grandmother Luella Hubbard   . Hypertension Maternal Grandmother [employer]   . Pancreatic cancer Maternal Grandmother Luella Hubbard   . Coronary artery bypass graft Maternal Grandfather Grandmother   . Prostate cancer Maternal Grandfather Grandmother   . Emphysema Maternal Grandfather Grandmother   . Fibrosis Maternal Grandfather Grandmother   . Hypertension Maternal Grandfather Grandmother

## 2024-05-04 ENCOUNTER — Encounter: Payer: Self-pay | Admitting: Obstetrics and Gynecology

## 2024-05-04 ENCOUNTER — Ambulatory Visit: Admitting: Obstetrics and Gynecology

## 2024-05-04 VITALS — BP 129/85 | HR 97 | Ht 68.0 in | Wt 295.0 lb

## 2024-05-04 DIAGNOSIS — N951 Menopausal and female climacteric states: Secondary | ICD-10-CM

## 2024-05-04 MED ORDER — ESTRADIOL 0.025 MG/24HR TD PTTW: 1.0000 | MEDICATED_PATCH | TRANSDERMAL | 12 refills | Status: DC

## 2024-05-04 MED ORDER — PROGESTERONE MICRONIZED 100 MG PO CAPS: 100.0000 mg | ORAL_CAPSULE | Freq: Every day | ORAL | 3 refills | Status: AC

## 2024-05-04 NOTE — Progress Notes (Signed)
 GYNECOLOGY OFFICE VISIT NOTE  History:  Kiara Andrews is a 53 y.o. G1P0101 here today to discuss HT.  CC:  Pt reported in last year has had mobility issues (hypermobility) and inflammation, would like to discuss HRT, hot flashes, not sleeping well, deflated feeling. In 2023, having regular cycles, 2024 Jan full cycle, June spotting, July full cycle, 2025 spotting July, spotting October. Last pap 03/2021 in [place] , reported normal. Does have history of irregular cells over 10 years ago, biopsy with colpo.     Discussed the use of AI scribe software for clinical note transcription with the patient, who gave verbal consent to proceed.  History of Present Illness Kiara Andrews is a 53 year old female who presents with symptoms of menopause and seeks hormone therapy. She is accompanied by Erwin, who is her sister.  She has been experiencing significant menopausal symptoms over the past year, including hot flashes, night sweats, and disrupted sleep. Her sleep is very interrupted, waking every hour or two, leading to a significant decline in energy and drive. She manages her hot flashes with sea moss, spirulina, and raspberry, which have been effective, but night sweats and disrupted sleep persist.  Her last full menstrual period was in July 2025, with spotting occurring throughout the year. She is uncertain whether she is in perimenopause or menopause [facility] was 30.4 in August 2025). She has not been sexually active. She has not had a tubal ligation.  She is highly sensitive to insulin and monitors her intake of natural sugars to prevent hot flashes. She also mentions having hypermobility and relaxed joints, which she believes may be affected by decreased estrogen levels. She has been trying natural remedies but feels they may not be sufficient given her current symptoms.   Past Medical History:  Diagnosis Date   ADHD    Anxiety    Eating disorder    Obesity    PTSD (post-traumatic  stress disorder)    military service    Past Surgical History:  Procedure Laterality Date   bilateral bunion surgery     BREAST REDUCTION SURGERY Bilateral 1994   TONSILLECTOMY  1987    The following portions of the patient's history were reviewed and updated as appropriate: allergies, current medications, past family history, past medical history, past social history, past surgical history and problem list.   Health Maintenance:   July 2022: Pap/HPV wnl  Review of Systems:  Pertinent items noted in HPI and remainder of comprehensive ROS otherwise negative.  Physical Exam:  BP 129/85   Pulse 97   Ht 5' 8 (1.727 m)   Wt 295 lb (133.8 kg)   LMP  (LMP Unknown)   BMI 44.85 kg/m  CONSTITUTIONAL: Well-developed, well-nourished female in no acute distress.  She is in a wheelchair.  HEENT:  Normocephalic, atraumatic. External right and left ear normal. No scleral icterus.  NECK: Normal range of motion, supple, no masses noted on observation SKIN: No rash noted. Not diaphoretic. No erythema. No pallor. MUSCULOSKELETAL: Normal range of motion. No edema noted. NEUROLOGIC: Alert and oriented to person, place, and time. Normal muscle tone coordination. No cranial nerve deficit noted. PSYCHIATRIC: Normal mood and affect. Normal behavior. Normal judgment and thought content.  PELVIC: Deferred  Labs and Imaging No results found for this or any previous visit (from the past week). No results found.  Assessment and Plan:  1. Menopausal state (Primary) Experiencing significant menopausal symptoms. Discussed HRT benefits and risks. Prefers estrogen patch and Prometrium  for lower risk profile. - We discussed the treatment options for menopause as well as indications - Discussed the benefits of each and relative effectiveness.  - Discussed goals of therapy i.e. reduction of hot flashes (not complete resolution). Reviewed full response takes up to 6-8 weeks, including for hormone therapy. -  Discussed if HT we do shortest amount of time at lowest dose. We discussed annual attempts at coming of the HT typically in the fall months when the weather has cooled down. - We discussed the differences in modes of therapy for HT -- patch vs oral therapy.  - Discussed risks of HT: E+P = breast cancer, clotting, MI/Stroke. Discussed risk of E alone. We reviewed that limits of data from the [facility] regarding breast cancer impact - only progesterone used in that study was provera which is biologically active in the breast. We MAY reduce that risk by doing a different progesterone based therapy I.e. norethindrone, prometrium. We reviewed the indication and necessity for progesterone and that estrogen alone in those with a uterus to prevent risk of endometrial cancer (due to unopposed estrogen) - Discussed different possible regimens - Advised to report any abnormal bleeding. - She would like: HT   Meds ordered this encounter  Medications   estradiol (VIVELLE-DOT) 0.025 MG/24HR    Sig: Place 1 patch onto the skin 2 (two) times a week.    Dispense:  8 patch    Refill:  12   progesterone (PROMETRIUM) 100 MG capsule    Sig: Take 1 capsule (100 mg total) by mouth daily.    Dispense:  90 capsule    Refill:  3     Routine preventative health maintenance measures emphasized. Please refer to After Visit Summary for other counseling recommendations.   No follow-ups on file.  Vina Solian, MD, FACOG Obstetrician & Gynecologist, [facility] for [facility], [facility]

## 2024-05-11 ENCOUNTER — Telehealth: Payer: Self-pay | Admitting: Family Medicine

## 2024-05-11 NOTE — Telephone Encounter (Signed)
 Started taking Zepbound  Also started on estrogen patches and progesterone  pills - seems to be tolerating well as of now.  Has a lot of medication sensitivities.   Continues to have hand pain and swelling. Pharmacist recommended waiting 2 weeks after starting Zepbound before starting on Colchicine . Has been on Zepbound for about 1.5 weeks now.   Also started on estrogen patches and progesterone  pills - seems to be tolerating well as of now.   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   Has concerns about taking Colchicine  - would like to know when to take the medication, how much to take, ect - d/t starting new meds and medication sensitivities.   Forwarding to Dr. Joane to review and advise.

## 2024-05-11 NOTE — Telephone Encounter (Signed)
 Per results note 02/29/24:  Kiara Artist RAMAN, MD to Kiara Andrews     03/03/24  5:50 AM Rheumatology labs are significantly elevated indicating that you almost certainly have a rheumatologic problem.  Thankfully you are already scheduled to see rheumatologist soon! Additionally uric acid is elevated indicating gout is definitely a possibility.  I have prescribed colchicine  and allopurinol .  Please take colchicine  daily for about 2 months.  This stops your body from reacting to uric acid crystals and it works quickly.  After too much you can take it just as needed for gout attacks. Allopurinol  should be taken daily and reduce his uric acid level. Recommend rechecking with me in about 1 month.

## 2024-05-11 NOTE — Telephone Encounter (Signed)
 Patient called with a question about taking Colchicine ? She is scheduled for MyChart visit with Dr Joane on Wednesday to discuss this and some other findings but I think she was hoping she would be able to start this medication sooner?  Please advise.

## 2024-05-12 ENCOUNTER — Telehealth: Payer: Self-pay | Admitting: Family Medicine

## 2024-05-12 NOTE — Telephone Encounter (Signed)
 Ran zilretta  benefits for bilateral knee case ID [id number]

## 2024-05-12 NOTE — Telephone Encounter (Signed)
 Patient called and would like to get shots in both knees-zilretta . Please confirm if patient can get these at next appointment.

## 2024-05-12 NOTE — Telephone Encounter (Signed)
 Pleas re-verify insurance coverage and prior auth requirements for Zilretta  for BILAT knee OA.   Last injections 02/29/24 Pt scheduled 05/17/24.   Pt not eligible for repeat injections until on or after 05/24/24.

## 2024-05-12 NOTE — Telephone Encounter (Signed)
 Patient scheduled appointment on 05/24/2024

## 2024-05-17 ENCOUNTER — Encounter: Payer: Self-pay | Admitting: Family Medicine

## 2024-05-17 ENCOUNTER — Ambulatory Visit: Admitting: Family Medicine

## 2024-05-17 VITALS — BP 122/84 | HR 87 | Ht 68.0 in

## 2024-05-17 DIAGNOSIS — R1319 Other dysphagia: Secondary | ICD-10-CM

## 2024-05-17 DIAGNOSIS — M059 Rheumatoid arthritis with rheumatoid factor, unspecified: Secondary | ICD-10-CM | POA: Diagnosis not present

## 2024-05-17 DIAGNOSIS — Q7962 Hypermobile Ehlers-Danlos syndrome: Secondary | ICD-10-CM | POA: Diagnosis not present

## 2024-05-17 NOTE — Patient Instructions (Addendum)
 Thank you for coming in today.  Life Extension Magnesium L-Threonate  Follow-up with rheumatology. I think starting a medication is essential.   I've ordered a Modified Barium Swallow Study. You should hear soon about scheduling

## 2024-05-17 NOTE — Progress Notes (Signed)
 I, Kiara Andrews, CMA acting as a scribe for Kiara Lloyd, MD.  Kiara Andrews is a 53 y.o. female who presents to [facility] at [facility] today for questions on her rx and polyarthralgia. Pt was last seen by Dr. Lloyd on 03/08/24 and was advised to see what rheumatology said and then plan treatment from there.  Today, pt c/o continued bilateral hand pain. Also continues to have R knee pain. C/O swelling in the R knee.Has concerns for hypermobility. Taking IBU as needed. Discuss Colchicine . Feels that Zepbound and hormones has been helpful for balance and inflammation   MS: Chronic joint pain, Chronic wide spread muscle pain, Joint Hypermobility, Joint Instability, Joint Subluxation, and Slipping Ribs / Costochondritis  Skin/Immune reactions: Fragile Skin that tears easily, Soft Velvety Skin, Poor Wound Healing, Easy Bruising / Bleeding, Atrophic Scars, Rashes / Hives, and Medication / Chemical / Food Sensitivities  Neurological: Reduced Proprioception, Clumsiness , Burning, Numbness and/or Tingling in Extremities, Cognitive Dysfunction / Brain Fog, Sleep Disturbance, and Sensory Sensitivities ANS: Fatigue, Exercise / Temperature Intolerance , and Anxiety / Panic Respiratory: Dyspnea and Chest Tightness CV: Tachycardia and Varicose Veins GI:  GERD, IBS with Diarrhea or Constipation, Painful ABD Bloating, and Frequent N/V Genitourinary: Pelvic Pain / Fullness / Pressure, Incontinence, Recurrent UTI / Cystitis, and Painful Intercourse Hands & Feet: Piezogenic Papules and Flat Feet   Have you done formal Physical Therapy? If so, when, where and for how long? currently Have you or do you participate in a home exercise program? Exacerbates sx  Patient does note some ongoing difficulty swallowing.  She feels as though food gets stuck or the food goes down the wrong pipe.  Dx testing: 02/29/24 Labs   08/17/23 R & L knee XR  Pertinent review of systems: No fevers or  chills  Relevant historical information: Seropositive rheumatoid arthritis.  Patient is not currently taking disease modifying antirheumatic's.  Exam:  BP 122/84   Pulse 87   Ht 5' 8 (1.727 m)   LMP  (LMP Unknown)   SpO2 97%   BMI 44.85 kg/m  General: Well Developed, well nourished, and in no acute distress.   MSK: Hands and wrist bilaterally show significant wrist effusion with synovitis across the MCPs.  Hypermobility evaluation is positive with a Beighton score of 4. Ehlers-Danlos evaluation is positive with a score of 5.   Lab and Radiology Results No results found for this or any previous visit (from the past 72 hours). No results found.     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  Dominant issue right now is the rheumatoid arthritis which in my opinion should be treated.  We spent time talking about her options and I recommend that she should when seeing her rheumatologist again in the near future consider starting offered medications.  I did prescribe colchicine  at the last visit and she could certainly try that over the next week to see if that makes any difference although I am less optimistic about colchicine .  She does have a diagnosis of hypermobile EDS today.  Long-term option should be physical therapy for this which she is scheduled to start aquatic physical therapy in February 2026.  Lastly she does mention some dysphagia ongoing chronically.  Plan for speech therapy evaluation and modified barium swallow study.   PDMP not reviewed this encounter. Orders Placed This Encounter  Procedures   DG Swallowing [facility] Pathology  Standing Status:   Future    Expiration Date:   05/17/2025    Reason for exam::   dysphagia    Is the patient pregnant?:   No    Preferred imaging location?:   [facility]   DG OP Swallowing [facility]-Medicare/Speech Path     Standing Status:   Future    Expiration Date:   05/17/2025    Reason for Exam (SYMPTOM  OR DIAGNOSIS REQUIRED):   dysphagia    Is the patient pregnant?:   No    Where should this test be performed?:   [name]   SLP modified barium swallow    Standing Status:   Future    Expiration Date:   05/17/2025    Where should this test be performed::   [name]    Please indicate reason for Referral::   Concerned about Dysphagia/Aspiration    Patients current diet consistency::   Regular    Patients current liquid consistency::   Nectar Thick    Existing signs/symptoms of possible Aspiration/Dysphagia::   Coughing up mucus/phlegm    Other risk factors for Dysphagia::   Dysarthria/poor oral motor skills   No orders of the defined types were placed in this encounter.    Discussed warning signs or symptoms. Please see discharge instructions. Patient expresses understanding.   The above documentation has been reviewed and is accurate and complete Kiara Andrews, M.D.  Total encounter time 40 minutes including face-to-face time with the patient and, reviewing past medical record, and charting on the date of service.

## 2024-05-19 ENCOUNTER — Telehealth: Payer: Self-pay | Admitting: Family Medicine

## 2024-05-19 NOTE — Telephone Encounter (Signed)
 Pt called, say rheumatology today and they want to start her on Humera asap, possibly today.  Pt has been taking colchicine  for 3 days, just realized we also rx'd allopurinal. 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 Should she be taking any of this with the Humera?  Please call today if possible.

## 2024-05-19 NOTE — Telephone Encounter (Signed)
 Forwarding to Dr. Denyse Amass to review and advise.

## 2024-05-19 NOTE — Telephone Encounter (Signed)
 It is okay to take these medications together.  Definitely recommend talking about this with your rheumatologist as well.  I do think Humira can make a big difference.

## 2024-05-19 NOTE — Telephone Encounter (Signed)
 She was also asking if she was supposed to be taking both gout meds as she has been only taking the colchicine .

## 2024-05-19 NOTE — Telephone Encounter (Signed)
 Although I think it is reasonable to take the colchicine  and the allopurinol  I do not think they are entirely necessary.  It looks like the pain is not due to gout but gout and pseudogout treatment are more typically in the rheumatology specialties and sports medicine.  It is you are seeing a rheumatologist I think picking and choosing what medicines to take with the Humira are a great question ask your rheumatologist.  It is okay in my opinion but definitely okay to double check with rheumatology.

## 2024-05-19 NOTE — Telephone Encounter (Signed)
 Responded via pt's portal.

## 2024-05-22 ENCOUNTER — Ambulatory Visit (HOSPITAL_COMMUNITY)
Admission: RE | Admit: 2024-05-22 | Discharge: 2024-05-22 | Disposition: A | Discharge status: Home / Self Care | Source: Ambulatory Visit | Attending: Physician Assistant | Admitting: Physician Assistant

## 2024-05-22 ENCOUNTER — Inpatient Hospital Stay (HOSPITAL_COMMUNITY)
Admission: RE | Admit: 2024-05-22 | Discharge: 2024-05-22 | Attending: Physician Assistant | Admitting: Physician Assistant

## 2024-05-22 ENCOUNTER — Telehealth: Payer: Self-pay | Admitting: *Deleted

## 2024-05-22 DIAGNOSIS — R1319 Other dysphagia: Secondary | ICD-10-CM

## 2024-05-22 DIAGNOSIS — R059 Cough, unspecified: Secondary | ICD-10-CM | POA: Insufficient documentation

## 2024-05-22 NOTE — Telephone Encounter (Signed)
 Returned call from 3:27 PM. Left patient a message to send Dr. Cleatus a MyChart message about HRT medication question(s).

## 2024-05-22 NOTE — Evaluation (Signed)
 Modified Barium Swallow Study  Patient Details  Name: Kiara Andrews MRN: [id number] Date of Birth: November 28, 1970  Today's Date: 05/22/2024  Modified Barium Swallow completed.  Full report located under Chart Review in the Imaging Section.  History of Present Illness Kiara Andrews is a 53 yo female presenting for OP MBS due to symptoms of dysphagia that include coughing with PO intake as well as the sensation of solids/liquids getting stuck in her throat. PMH includes: hypermobile Ehlers-Danlos syndrome, reflux, PTSD, anxiety, ADHD, OSA, RA   Clinical Impression Pt's oropharyngeal swallow is WFL. She did have throat clearing throughout they study, and reported globus sensation, but with no findings to explain symptoms from an orophryngeal standpoint. No pharyngeal residue, laryngeal penetration, or aspiration observed. Pt does endorse a h/o reflux - question if this could be contributing to some of her symptoms. Would continue with regular solids and thin liquids as tolerated.  Factors that may increase risk of adverse event in presence of aspiration Noe & Lianne 2021): Limited mobility  Swallow Evaluation Recommendations Recommendations: PO diet PO Diet Recommendation: Regular;Thin liquids (Level 0) Liquid Administration via: Cup;Straw Medication Administration: Whole meds with liquid Supervision: Patient able to self-feed Postural changes: Position pt fully upright for meals Oral care recommendations: Oral care BID (2x/day)      Leita SAILOR., M.A. CCC-SLP Acute Rehabilitation Services Office: [phone]  Secure chat preferred  05/22/2024,2:07 PM

## 2024-05-23 NOTE — Progress Notes (Signed)
 "        I, Kiara Andrews, CMA acting as a scribe for Kiara Lloyd, MD.  Kiara Andrews is a 53 y.o. female who presents to [facility] at [facility] today for f/u bilat knee pain in the setting of hEDS and seropositive rheumatoid arthritis. Pt was last seen by Dr. Lloyd on 05/17/24 and was advised to f/u w/ her rheumatologist and referred to speech therapy and modified barium swallow study ordered.  Today, pt reports R>L knee pain. Also c/o bilat wrist pain d/t RA. Would like Zilretta  injection for right knee and bilat wrist injections.   Pertinent review of systems: No fevers or chills  Relevant historical information: Rheumatoid arthritis   Exam:  BP 136/84   Pulse (!) 101   LMP  (LMP Unknown)   SpO2 97%  General: Well Developed, well nourished, and in no acute distress.   MSK: Bilateral wrist moderate joint effusion decreased range of motion.  Right knee mild effusion normal motion.    Lab and Radiology Results  Procedure: Real-time Ultrasound Guided Injection of right wrist dorsal approach radiocarpal joint Device: Philips Affiniti 50G/GE Logiq Images permanently stored and available for review in PACS Verbal informed consent obtained.  Discussed risks and benefits of procedure. Warned about infection, bleeding, hyperglycemia damage to structures among others. Patient expresses understanding and agreement Time-out conducted.   Noted no overlying erythema, induration, or other signs of local infection.   Skin prepped in a sterile fashion.   Local anesthesia: Topical Ethyl chloride.   With sterile technique and under real time ultrasound guidance: 40 mg of Depo-Medrol  and 1 mL of Marcaine injected into wrist joint. Fluid seen entering the joint capsule.   Completed without difficulty   Pain immediately resolved suggesting accurate placement of the medication.   Advised to call if fevers/chills, erythema, induration, drainage, or persistent bleeding.   Images  permanently stored and available for review in the ultrasound unit.  Impression: Technically successful ultrasound guided injection.    Procedure: Real-time Ultrasound Guided Injection of left wrist radiocarpal joint dorsal approach Device: Philips Affiniti 50G/GE Logiq Images permanently stored and available for review in PACS Verbal informed consent obtained.  Discussed risks and benefits of procedure. Warned about infection, bleeding, hyperglycemia damage to structures among others. Patient expresses understanding and agreement Time-out conducted.   Noted no overlying erythema, induration, or other signs of local infection.   Skin prepped in a sterile fashion.   Local anesthesia: Topical Ethyl chloride.   With sterile technique and under real time ultrasound guidance: 40 mg of Depo-Medrol  and 1 mL of Marcaine injected into wrist joint. Fluid seen entering the joint capsule.   Completed without difficulty   Pain immediately resolved suggesting accurate placement of the medication.   Advised to call if fevers/chills, erythema, induration, drainage, or persistent bleeding.   Images permanently stored and available for review in the ultrasound unit.  Impression: Technically successful ultrasound guided injection.     Zilretta  injection right knee Procedure: Real-time Ultrasound Guided Injection of right knee joint superior lateral patellar space Device: Philips Affiniti 50G Images permanently stored and available for review in PACS Verbal informed consent obtained.  Discussed risks and benefits of procedure. Warned about infection, hyperglycemia bleeding, damage to structures among others. Patient expresses understanding and agreement Time-out conducted.   Noted no overlying erythema, induration, or other signs of local infection.   Skin prepped in a sterile fashion.   Local anesthesia: Topical Ethyl chloride.   With  sterile technique and under real time ultrasound guidance: Zilretta   32 mg injected into knee joint. Fluid seen entering the joint capsule.   Completed without difficulty   Advised to call if fevers/chills, erythema, induration, drainage, or persistent bleeding.   Images permanently stored and available for review in the ultrasound unit.  Impression: Technically successful ultrasound guided injection.      Assessment and Plan: 53 y.o. female with bilateral wrist pain due to rheumatoid arthritis with some degenerative changes.  Plan for injection today.  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  Plan for Zilretta  injection.  She is just about to get started on disease modifying antirheumatic's prescribed by rheumatology.  I recommend taking the medicine rheumatology recommended.   PDMP not reviewed this encounter. Orders Placed This Encounter  Procedures   US  LIMITED JOINT SPACE STRUCTURES LOW BILAT(NO LINKED CHARGES)    Reason for Exam (SYMPTOM  OR DIAGNOSIS REQUIRED):   Bilat knee pain    Preferred imaging location?:   [facility] ordered this encounter  Medications   Triamcinolone  Acetonide (ZILRETTA ) intra-articular injection 32 mg   methylPREDNISolone  acetate (DEPO-MEDROL ) injection 40 mg   methylPREDNISolone  acetate (DEPO-MEDROL ) injection 40 mg     Discussed warning signs or symptoms. Please see discharge instructions. Patient expresses understanding.   The above documentation has been reviewed and is accurate and complete Kiara Andrews, M.D.   "

## 2024-05-23 NOTE — Telephone Encounter (Signed)
 Scheduled for 05/24/2024  Zilretta  authorized for bilateral knee NO PRE CERT REQUIRED  Patient covered at 100% Copay, deductible, and OOP do not apply to these services This contract year is 01/31/24-01/29/2025 Reference # Crystal [id number] 05/17/2024

## 2024-05-24 ENCOUNTER — Other Ambulatory Visit: Payer: Self-pay

## 2024-05-24 ENCOUNTER — Encounter: Payer: Self-pay | Admitting: Family Medicine

## 2024-05-24 ENCOUNTER — Ambulatory Visit: Payer: Self-pay | Admitting: Family Medicine

## 2024-05-24 ENCOUNTER — Ambulatory Visit: Admitting: Family Medicine

## 2024-05-24 VITALS — BP 136/84 | HR 101

## 2024-05-24 DIAGNOSIS — G8929 Other chronic pain: Secondary | ICD-10-CM | POA: Diagnosis not present

## 2024-05-24 DIAGNOSIS — M25531 Pain in right wrist: Secondary | ICD-10-CM

## 2024-05-24 DIAGNOSIS — M25561 Pain in right knee: Secondary | ICD-10-CM

## 2024-05-24 DIAGNOSIS — M25562 Pain in left knee: Secondary | ICD-10-CM | POA: Diagnosis not present

## 2024-05-24 DIAGNOSIS — M170 Bilateral primary osteoarthritis of knee: Secondary | ICD-10-CM | POA: Diagnosis not present

## 2024-05-24 DIAGNOSIS — M25532 Pain in left wrist: Secondary | ICD-10-CM | POA: Diagnosis not present

## 2024-05-24 DIAGNOSIS — M059 Rheumatoid arthritis with rheumatoid factor, unspecified: Secondary | ICD-10-CM

## 2024-05-24 MED ORDER — METHYLPREDNISOLONE ACETATE 40 MG/ML IJ SUSP
40.0000 mg | Freq: Once | INTRAMUSCULAR | Status: AC
  Administered 2024-05-24: 40 mg via INTRA_ARTICULAR

## 2024-05-24 MED ORDER — TRIAMCINOLONE ACETONIDE 32 MG IX SRER
32.0000 mg | Freq: Once | INTRA_ARTICULAR | Status: AC
  Administered 2024-05-24: 32 mg via INTRA_ARTICULAR

## 2024-05-24 NOTE — Progress Notes (Signed)
 Swallow study looks okay.

## 2024-05-24 NOTE — Patient Instructions (Addendum)
 Thank you for coming in today.   You received an injection today. Seek immediate medical attention if the joint becomes red, extremely painful, or is oozing fluid.   The Pain Management Workbook: Powerful CBT and Mindfulness Skills to Take Control of Pain and Reclaim Your Life https://www.zoffness.com/

## 2024-05-24 NOTE — Telephone Encounter (Signed)
Forwarding to Dr. Corey to review.  

## 2024-05-29 ENCOUNTER — Telehealth: Payer: Self-pay | Admitting: Family Medicine

## 2024-05-29 NOTE — Telephone Encounter (Signed)
 Patient called and said she is on Colchicine  and Dr. Joane told her to take it every day and then It changed to just for a week. She has been taking it now for 10 days and it seems to be helping with her swelling. She would like to know if she should keep taking it everyday. Please advise.

## 2024-05-31 NOTE — Telephone Encounter (Signed)
 Forwarding to Dr. Denyse Amass as Lorain Childes.

## 2024-06-02 NOTE — Telephone Encounter (Signed)
 Okay to continue taking colchicine  if it helps I do think following up with rheumatology is very important.

## 2024-06-06 NOTE — Telephone Encounter (Signed)
 Forwarding to Dr. Denyse Amass to review and advise.

## 2024-06-06 NOTE — Progress Notes (Deleted)
 " OUTPATIENT PHYSICAL THERAPY THORACOLUMBAR EVALUATION   Patient Name: Kiara Andrews MRN: [id number] DOB:09/20/1970, 54 y.o., female Today's Date: 06/06/2024  END OF SESSION:   Past Medical History:  Diagnosis Date   ADHD    Anxiety    Eating disorder    Obesity    PTSD (post-traumatic stress disorder)    military service   Past Surgical History:  Procedure Laterality Date   bilateral bunion surgery     BREAST REDUCTION SURGERY Bilateral 1994   TONSILLECTOMY  1987   Patient Active Problem List   Diagnosis Date Noted   Seropositive rheumatoid arthritis (HCC) 05/17/2024   Hypermobile Ehlers-Danlos syndrome 05/17/2024   GAD (generalized anxiety disorder) 02/15/2024   Lumbar spondylosis 02/15/2024   OSA (obstructive sleep apnea) 08/14/2020   Iron deficiency anemia 06/04/2020   Class 3 severe obesity due to excess calories without serious comorbidity with body mass index (BMI) of 40.0 to 44.9 in adult (HCC) 02/22/2020   Low HDL (under 40) 02/22/2020   Vitamin D deficiency 03/27/2011    PCP: Sharl Shaper PA-C  REFERRING PROVIDER: Corie Sherlean Caldron, PA-C   REFERRING DIAG:  Diagnosis  M47.816 (ICD-10-CM) - Spondylosis without myelopathy or radiculopathy, lumbar region  M25.50 (ICD-10-CM) - Pain in unspecified joint  M05.79 (ICD-10-CM) - Rheumatoid arthritis with rheumatoid factor of multiple sites without organ or systems involvement    Rationale for Evaluation and Treatment: Rehabilitation  THERAPY DIAG:  No diagnosis found.  ONSET DATE: ***  SUBJECTIVE:                                                                                                                                                                                           SUBJECTIVE STATEMENT: ***  PERTINENT HISTORY:  ***  PAIN:  Are you having pain? {OPRCPAIN:27236}  PRECAUTIONS: {Therapy precautions:24002}  RED FLAGS: {PT Red Flags:29287}   WEIGHT BEARING RESTRICTIONS: {Yes  ***/No:24003}  FALLS:  Has patient fallen in last 6 months? {fallsyesno:27318}  LIVING ENVIRONMENT: Lives with: {OPRC lives with:25569::lives with their family} Lives in: {Lives in:25570} Stairs: {opstairs:27293} Has following equipment at home: {Assistive devices:23999}  OCCUPATION: ***  PLOF: {PLOF:24004}  PATIENT GOALS: ***  NEXT MD VISIT: ***  OBJECTIVE:  Note: Objective measures were completed at Evaluation unless otherwise noted.  DIAGNOSTIC FINDINGS:  ***  PATIENT SURVEYS:  {rehab surveys:24030}  COGNITION: Overall cognitive status: {cognition:24006}     SENSATION: {sensation:27233}  MUSCLE LENGTH: Hamstrings: Right *** deg; Left *** deg Thomas test: Right *** deg; Left *** deg  POSTURE: {posture:25561}  PALPATION: ***  LUMBAR ROM:   AROM eval  Flexion   Extension  Right lateral flexion   Left lateral flexion   Right rotation   Left rotation    (Blank rows = not tested)  LOWER EXTREMITY ROM:     {AROM/PROM:27142}  Right eval Left eval  Hip flexion    Hip extension    Hip abduction    Hip adduction    Hip internal rotation    Hip external rotation    Knee flexion    Knee extension    Ankle dorsiflexion    Ankle plantarflexion    Ankle inversion    Ankle eversion     (Blank rows = not tested)  LOWER EXTREMITY MMT:    MMT Right eval Left eval  Hip flexion    Hip extension    Hip abduction    Hip adduction    Hip internal rotation    Hip external rotation    Knee flexion    Knee extension    Ankle dorsiflexion    Ankle plantarflexion    Ankle inversion    Ankle eversion     (Blank rows = not tested)  LUMBAR SPECIAL TESTS:  {lumbar special test:25242}  FUNCTIONAL TESTS:  {Functional tests:24029}  GAIT: Distance walked: *** Assistive device utilized: {Assistive devices:23999} Level of assistance: {Levels of assistance:24026} Comments: ***  TREATMENT DATE: ***                                                                                                                                  PATIENT EDUCATION:  Education details: *** Person educated: {Person educated:25204} Education method: {Education Method:25205} Education comprehension: {Education Comprehension:25206}  HOME EXERCISE PROGRAM: ***  ASSESSMENT:  CLINICAL IMPRESSION: Patient is a *** y.o. *** who was seen today for physical therapy evaluation and treatment for ***.   OBJECTIVE IMPAIRMENTS: {opptimpairments:25111}.   ACTIVITY LIMITATIONS: {activitylimitations:27494}  PARTICIPATION LIMITATIONS: {participationrestrictions:25113}  PERSONAL FACTORS: {Personal factors:25162} are also affecting patient's functional outcome.   REHAB POTENTIAL: {rehabpotential:25112}  CLINICAL DECISION MAKING: {clinical decision making:25114}  EVALUATION COMPLEXITY: {Evaluation complexity:25115}   GOALS: Goals reviewed with patient? {yes/no:20286}  SHORT TERM GOALS: Target date: ***  *** Baseline: Goal status: INITIAL  2.  *** Baseline:  Goal status: INITIAL  3.  *** Baseline:  Goal status: INITIAL  4.  *** Baseline:  Goal status: INITIAL  5.  *** Baseline:  Goal status: INITIAL  6.  *** Baseline:  Goal status: INITIAL  LONG TERM GOALS: Target date: ***  *** Baseline:  Goal status: INITIAL  2.  *** Baseline:  Goal status: INITIAL  3.  *** Baseline:  Goal status: INITIAL  4.  *** Baseline:  Goal status: INITIAL  5.  *** Baseline:  Goal status: INITIAL  6.  *** Baseline:  Goal status: INITIAL  PLAN:  PT FREQUENCY: {rehab frequency:25116}  PT DURATION: {rehab duration:25117}  PLANNED INTERVENTIONS: {rehab planned interventions:25118::97110-Therapeutic exercises,97530- Therapeutic [phone]- Neuromuscular re-education,97535- Self Rjmz,02859- Manual therapy,Patient/Family education}.  PLAN FOR NEXT SESSION: ***  Burnard Joy, PT 06/06/2024 4:32 PM  "

## 2024-06-07 ENCOUNTER — Other Ambulatory Visit: Payer: Self-pay

## 2024-06-07 ENCOUNTER — Ambulatory Visit

## 2024-06-07 ENCOUNTER — Encounter: Payer: Self-pay | Admitting: Family Medicine

## 2024-06-07 ENCOUNTER — Ambulatory Visit (INDEPENDENT_AMBULATORY_CARE_PROVIDER_SITE_OTHER): Admitting: Family Medicine

## 2024-06-07 VITALS — BP 120/84 | HR 83 | Ht 68.0 in

## 2024-06-07 DIAGNOSIS — M25532 Pain in left wrist: Secondary | ICD-10-CM | POA: Diagnosis not present

## 2024-06-07 DIAGNOSIS — M79642 Pain in left hand: Secondary | ICD-10-CM

## 2024-06-07 DIAGNOSIS — M67441 Ganglion, right hand: Secondary | ICD-10-CM | POA: Diagnosis not present

## 2024-06-07 DIAGNOSIS — M25531 Pain in right wrist: Secondary | ICD-10-CM | POA: Diagnosis not present

## 2024-06-07 DIAGNOSIS — M79641 Pain in right hand: Secondary | ICD-10-CM | POA: Diagnosis not present

## 2024-06-07 NOTE — Progress Notes (Signed)
"       ° °  I, Leotis Batter, CMA acting as a neurosurgeon for Kiara Lloyd, MD.  Kiara Andrews is a 54 y.o. female who presents to [facility] today for f/u bilat knee and bilat wrist pain in the setting of hEDS and seropositive rheumatoid arthritis. Pt was last seen by Dr. Lloyd on 05/24/24 and was given a R dorsal wrist and L radiocarpal steroid injections and R knee Zilretta  injection.  Today, pt reports cyst on the right hand at 3rd digit. Palpable nodule, somewhat mobile, painful with touch. Also having pain in the right elbow, lateral aspect. Also c/o continued left ankle pain s/p sprain several months ago, located to lateral aspect of the ankle. C/O limited ROM in the 2nd and 3rd digits after last injection.   Pertinent review of systems: No fever or chills  Relevant historical information: Sleep apnea and RA.  Referred to a different rheumatologist for a second opinion.   Exam:  BP 120/84   Pulse 83   Ht 5' 8 (1.727 m)   SpO2 97%   BMI 44.85 kg/m  General: Well Developed, well nourished, and in no acute distress.   MSK: Right hand some hand and wrist swelling.  Palmar tender nodule third palmar MCP.  No triggering present.  Wrist bilaterally mild effusion.    Lab and Radiology Results  Procedure: Real-time Ultrasound Guided Injection of right third MCP palmar ganglion cyst Device: Philips Affiniti 50G/GE Logiq Images permanently stored and available for review in PACS Verbal informed consent obtained.  Discussed risks and benefits of procedure. Warned about infection, bleeding, hyperglycemia damage to structures among others. Patient expresses understanding and agreement Time-out conducted.   Noted no overlying erythema, induration, or other signs of local infection.   Skin prepped in a sterile fashion.   Local anesthesia: Topical Ethyl chloride.   With sterile technique and under real time ultrasound guidance: 1 mg of Kenalog  and 1 mL of  lidocaine injected into tiny ganglion cyst palmar third MCP. Fluid seen entering the ganglion cyst.   Completed without difficulty   Pain immediately resolved suggesting accurate placement of the medication.   Advised to call if fevers/chills, erythema, induration, drainage, or persistent bleeding.   Images permanently stored and available for review in the ultrasound unit.  Impression: Technically successful ultrasound guided injection.        Assessment and Plan: 54 y.o. female with continued diffuse pain especially in the hands and wrists related primarily to rheumatoid arthritis. 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  However with untreated RA I am not optimistic about being able to get her pain better controlled.  Encouraged her to keep her follow-up appointment with rheumatology.  Today she also has a ganglion cyst associate with the flexor digit of her right third MCP.  Plan for injection.  Return in 1 month.   PDMP not reviewed this encounter. Orders Placed This Encounter  Procedures   US  LIMITED JOINT SPACE STRUCTURES UP BILAT(NO LINKED CHARGES)    Reason for Exam (SYMPTOM  OR DIAGNOSIS REQUIRED):   bilat wrist and hand pain    Preferred imaging location?:   [facility]   No orders of the defined types were placed in this encounter.    Discussed warning signs or symptoms. Please see discharge instructions. Patient expresses understanding.   The above documentation has been reviewed and is accurate and complete Kiara Andrews, M.D.   "

## 2024-06-07 NOTE — Patient Instructions (Addendum)
 Thank you for coming in today.   You received an injection today. Seek immediate medical attention if the joint becomes red, extremely painful, or is oozing fluid.   Let me know if you don't hear about scheduling with the rheumatologist  Check back in 1 month

## 2024-06-08 ENCOUNTER — Ambulatory Visit: Attending: Physician Assistant | Admitting: Rehabilitative and Restorative Service Providers"

## 2024-06-08 ENCOUNTER — Telehealth: Payer: Self-pay | Admitting: Family Medicine

## 2024-06-08 ENCOUNTER — Other Ambulatory Visit: Payer: Self-pay

## 2024-06-08 ENCOUNTER — Encounter: Payer: Self-pay | Admitting: Rehabilitative and Restorative Service Providers"

## 2024-06-08 DIAGNOSIS — M25571 Pain in right ankle and joints of right foot: Secondary | ICD-10-CM | POA: Insufficient documentation

## 2024-06-08 DIAGNOSIS — M0579 Rheumatoid arthritis with rheumatoid factor of multiple sites without organ or systems involvement: Secondary | ICD-10-CM | POA: Insufficient documentation

## 2024-06-08 DIAGNOSIS — M6281 Muscle weakness (generalized): Secondary | ICD-10-CM | POA: Diagnosis not present

## 2024-06-08 DIAGNOSIS — R293 Abnormal posture: Secondary | ICD-10-CM | POA: Diagnosis not present

## 2024-06-08 DIAGNOSIS — R2689 Other abnormalities of gait and mobility: Secondary | ICD-10-CM | POA: Insufficient documentation

## 2024-06-08 DIAGNOSIS — M25572 Pain in left ankle and joints of left foot: Secondary | ICD-10-CM | POA: Diagnosis not present

## 2024-06-08 DIAGNOSIS — M2550 Pain in unspecified joint: Secondary | ICD-10-CM | POA: Diagnosis present

## 2024-06-08 DIAGNOSIS — M47816 Spondylosis without myelopathy or radiculopathy, lumbar region: Secondary | ICD-10-CM | POA: Diagnosis present

## 2024-06-08 NOTE — Patient Instructions (Signed)
 [facility]  Welcome to [facility]! Here you will find all the information you will need regarding your pool therapy. If you have further questions at any time, please call our office at [phone]. After completing your initial evaluation in the [facility], you may be eligible to complete a portion of your therapy in the pool. A typical week of therapy will consist of 1-2 typical physical therapy visits at our [facility] location and an additional session of therapy in the pool located at the [facility] at [facility]. [address], [place] [address]. The phone number at the pool site is [phone]. Please call this number if you are running late or need to cancel your appointment.  Check-in on MyChart then meet your therapist at the pool deck. (If you can't access MyChart, you may check in with the therapist at the pool deck.)   Each session will last approximately 45 minutes. All scheduling and payments for aquatic therapy sessions, including cancelations, will be done through our [facility] location.  To be eligible for aquatic therapy, these criteria must be met: You must be able to independently change in the locker room and get to the pool deck. A caregiver can come with you to help if needed however they do need to be the same sex to enter the locker room. Or you may change in a bathroom privately with opposite sex caregiver if needed. There are benches for a caregiver to sit on next to the pool.  Handicap parking is available in the front and there is a drop off option for even closer accessibility.  Please arrive 15 minutes prior to your appointment to prepare for your pool session. You must sign in at the front desk upon your arrival. Please be sure to attend to any toileting needs prior to entering the pool. Locker rooms for changing are available.  There is direct access to the pool deck from the locker  room. You can lock your belongings in a locker or bring them with you poolside. Your therapist will greet you on the pool deck. There may be other swimmers in the pool at the same time but your session is one-on-one with the therapist.    What to Expect Arrive 15 min early for your appointment and check in with rehab front desk. Please limit use of body lotions and hair products before entering the pool. Locker rooms are available for showering, changing and toileting. Appointments are 45 minutes with your therapist. (This does not include changing times) The pool is approximately 500 feet from the nearest parking lot. There are benches and chairs along the walk. Please bring a support person if you need assistance traveling the distanceto the pool or assistance with changing/toileting. Stairs with handrails as well as a lift chair are available at the pool.  Depth is 3'6"-4'8" and temperature is between 88-90 degrees. The pool deck is tile flooring and gets slippery, water  shoes are strongly encouraged but not required. Please wear a bathing suit or athletic shorts and a t-shirt. Recommended to bring your own towel. Severe weather:Thunder or lightning results in closure of the pool deck for 30 minutes and is extended with each incidence. Your appointment may be moved to land or canceled with the option to reschedule. Tell your therapist if you have any of the following: Open wounds Active infection Fear of water  Bowel or bladder incontinence  Benefits of Aquatic Therapy:  Reduces Stress on Joints  and Muscles  The buoyancy of water  supports body weight, making movement easier and less painful.  Builds Strength and Stability  The viscosity of water  provides resistance that allows individuals to strengthen muscles while also providing a safe environment to improve balance and coordination.  Promotes Relaxation  The warm water  and the feeling of being supported can help reduce stress and be  beneficial for overall well-being.

## 2024-06-08 NOTE — Therapy (Signed)
 " OUTPATIENT PHYSICAL THERAPY LOWER EXTREMITY EVALUATION   Patient Name: Kiara Andrews MRN: [id number] DOB:1970-10-16, 54 y.o., female Today's Date: 06/08/2024  END OF SESSION:  PT End of Session - 06/08/24 0946     Visit Number 1    Date for Recertification  08/04/24    Authorization Type [employer]    PT Start Time [phone]    PT Stop Time 1010    PT Time Calculation (min) 31 min    Activity Tolerance Patient tolerated treatment well    Behavior During Therapy WFL for tasks assessed/performed          Past Medical History:  Diagnosis Date   ADHD    Anxiety    Eating disorder    Obesity    PTSD (post-traumatic stress disorder)    military service   Past Surgical History:  Procedure Laterality Date   bilateral bunion surgery     BREAST REDUCTION SURGERY Bilateral 1994   TONSILLECTOMY  1987   Patient Active Problem List   Diagnosis Date Noted   Seropositive rheumatoid arthritis (HCC) 05/17/2024   Hypermobile Ehlers-Danlos syndrome 05/17/2024   GAD (generalized anxiety disorder) 02/15/2024   Lumbar spondylosis 02/15/2024   OSA (obstructive sleep apnea) 08/14/2020   Iron deficiency anemia 06/04/2020   Class 3 severe obesity due to excess calories without serious comorbidity with body mass index (BMI) of 40.0 to 44.9 in adult (HCC) 02/22/2020   Low HDL (under 40) 02/22/2020   Vitamin D deficiency 03/27/2011    PCP: Sharl Tully HERO, PA-C  REFERRING PROVIDER: Corie Sherlean Caldron, PA-C  REFERRING DIAG: [phone] (ICD-10-CM) - Spondylosis without myelopathy or radiculopathy, lumbar region M25.50 (ICD-10-CM) - Pain in unspecified joint M05.79 (ICD-10-CM) - Rheumatoid arthritis with rheumatoid factor of multiple sites without organ or systems involvement  THERAPY DIAG:  Muscle weakness (generalized) - Plan: PT plan of care cert/re-cert  Abnormal posture - Plan: PT plan of care cert/re-cert  Other abnormalities of gait and mobility - Plan: PT plan of care  cert/re-cert  Bilateral ankle pain, unspecified chronicity - Plan: PT plan of care cert/re-cert  Rationale for Evaluation and Treatment: Rehabilitation  ONSET DATE: at least 2 years  SUBJECTIVE:   SUBJECTIVE STATEMENT: Patient states that she has EDS and hypermobility.  She reports that she is having a lot of ankle pain and low back pain.  Patient also reports that she has a tilted uterus.  She states that she has done aquatic PT in the past and it was very helpful.  Patient states that she is hoping that aquatic PT will help again.  PERTINENT HISTORY: RA, EDS, sleep apnea  PAIN:  Are you having pain? Yes: NPRS scale: 6/10 Pain location: general body Pain description: nervy, sharp, aching Aggravating factors: certain movements Relieving factors: pain medication  PRECAUTIONS: Fall  RED FLAGS: None   WEIGHT BEARING RESTRICTIONS: No  FALLS:  Has patient fallen in last 6 months? No falls, but has had some stumbles where she did not fully fall  LIVING ENVIRONMENT: Lives with: lives with their family Lives in: House/apartment Stairs: two story, but primarily on first floor Has following equipment at home: Environmental Consultant - 2 wheeled, Wheelchair (manual), shower chair, Grab bars, and raised toilet seat  OCCUPATION: Disability  PLOF: Requires assistive device for independence, Needs assistance with ADLs, and Leisure: crocheting, sticker books  PATIENT GOALS: To strengthen overall and help decrease risk of re-injuring the joints.  NEXT MD VISIT: February 2026  OBJECTIVE:  Note: Objective measures  were completed at Evaluation unless otherwise noted.  DIAGNOSTIC FINDINGS:  Lumbar Radiograph on 03/20/2024: IMPRESSION:  No acute compression deformity. Dextroscoliosis centered at L3. Grade 1 anterolisthesis of L4. Moderate multilevel degenerative disc disease, worst at L5-S1. Moderate to severe lower lumbar spine facet arthropathy.   Left Knee Radiograph on  08/17/2023: FINDINGS: Osteopenia. No acute fracture or dislocation. Moderate joint space narrowing of the medial compartment. Moderate osteophyte formation of the lateral compartment. Mild osteophyte formation of the patellofemoral compartment. No area of erosion or osseous destruction. No unexpected radiopaque foreign body. Soft tissues are unremarkable.  Right knee Radiograph on 08/17/2023: IMPRESSION: Tricompartmental degenerative changes of the right knee, most pronounced in the medial compartment.     PATIENT SURVEYS:  LEFS  Extreme difficulty/unable (0), Quite a bit of difficulty (1), Moderate difficulty (2), Little difficulty (3), No difficulty (4) Survey date:  06/09/23  Any of your usual work, housework or school activities 0  2. Usual hobbies, recreational or sporting activities 1  3. Getting into/out of the bath 0  4. Walking between rooms 0  5. Putting on socks/shoes 0  6. Squatting  0  7. Lifting an object, like a bag of groceries from the floor 0  8. Performing light activities around your home 1  9. Performing heavy activities around your home 0  10. Getting into/out of a car 0  11. Walking 2 blocks 0  12. Walking 1 mile 0  13. Going up/down 10 stairs (1 flight) 0  14. Standing for 1 hour 0  15.  sitting for 1 hour 1  16. Running on even ground 0  17. Running on uneven ground 0  18. Making sharp turns while running fast 0  19. Hopping  0  20. Rolling over in bed 1  Score total:  4/80 = 5%     COGNITION: Overall cognitive status: Within functional limits for tasks assessed     SENSATION: Patient reports numbness and tingling into hands and sometimes at top of feet and toes  EDEMA:  Reports that she gets ankle swelling  MUSCLE LENGTH: Hamstrings: WFL Patient reports that she has had some hip tightness recently  POSTURE: rounded shoulders and flexed trunk     LOWER EXTREMITY MMT:  Eval: Hip strength grossly 4-/5 throughout Bilateral quad strength of  4/5   FUNCTIONAL TESTS:  Eval: Standing tolerance:  30 sec at barre  GAIT: Distance walked: a couple of steps Assistive device utilized: Environmental Consultant - 2 wheeled Level of assistance: Min A and Mod A Comments: To take a couple of steps for transfers                                                                                                                                TREATMENT DATE:  06/08/2024: Discussed role of PT Reviewed HEP    PATIENT EDUCATION:  Education details: Issued HEP Person educated: Patient Education method: Explanation, Facilities Manager, and Handouts Education  comprehension: verbalized understanding  HOME EXERCISE PROGRAM: Access Code: [email or web address] URL: [email or web address] Date: 06/08/2024 Prepared by: Jarrell Laming  Exercises - Seated Heel Toe Raises  - 2 x daily - 7 x weekly - 1-2 sets - 10 reps - Seated March  - 1-2 x daily - 7 x weekly - 1-2 sets - 10 reps - Seated Long Arc Quad  - 1-2 x daily - 7 x weekly - 1-2 sets - 10 reps - Seated Hip Adduction Isometrics with Ball  - 1-2 x daily - 7 x weekly - 1-2 sets - 10 reps  ASSESSMENT:  CLINICAL IMPRESSION: Patient is a 54 y.o. female who was seen today for physical therapy evaluation and treatment for lumbar spondylosis and RA.  Patient reports that she has been living with her sister and she has been helping her with various household tasks.  Patient is essentially wheelchair bound at this time.  Patient reports that she can only take a couple of steps during transfers and requires assist with various transfers for safety.  Patient was able to perform sit to stand transfer at barre with CGA and able to maintain standing for 30 seconds before she returned to sitting. Patient reports that she has hypermobility from her EDS and she has been having a lot of joint pain throughout her various joints.  She is currently working with OT at [facility] for her wrists/hands.  Patient states that she has seen  the most success with aquatic PT in the past and is hoping that she will have similar results with that again.  Patient presents with muscle weakness, increased pain, decreased standing tolerance and difficulty performing various functional tasks.  Patient would benefit from skilled PT to address her functional impairments and allow her to have increased independence at home.  OBJECTIVE IMPAIRMENTS: decreased activity tolerance, decreased balance, decreased mobility, difficulty walking, decreased strength, postural dysfunction, obesity, and pain.   ACTIVITY LIMITATIONS: carrying, lifting, bending, standing, squatting, transfers, bed mobility, and dressing  PARTICIPATION LIMITATIONS: meal prep, cleaning, laundry, and community activity  PERSONAL FACTORS: Past/current experiences, Time since onset of injury/illness/exacerbation, and 3+ comorbidities: RA, EDS, lumbar spondylosis are also affecting patient's functional outcome.   REHAB POTENTIAL: Good  CLINICAL DECISION MAKING: Evolving/moderate complexity  EVALUATION COMPLEXITY: Moderate   GOALS: Goals reviewed with patient? Yes  SHORT TERM GOALS: Target date: 06/30/2024 Patient will be independent with initial HEP. Baseline: Goal status: INITIAL   LONG TERM GOALS: Target date: 08/04/2024  Patient will be independent with advanced HEP to allow for self progression after discharge. Baseline:  Goal status: INITIAL  2.  Patient will be able to stand for 2 minutes without increased pain to allow for completion of ADLs/IADLs. Baseline:  Goal status: INITIAL  3.  Patient will improve LE functional strength to [facility] to allow her to perform various types of transfers independently. Baseline:  Goal status: INITIAL  4.  Patient will increase Lower Extremity Functional Scale to at least 15% to demonstrate improvements in functional status. Baseline: 5% Goal status: INITIAL    PLAN:  PT FREQUENCY: 1-2x/week  PT DURATION: 8  weeks  PLANNED INTERVENTIONS: 97164- PT Re-evaluation, 97750- Physical Performance Testing, 97110-Therapeutic exercises, 97530- Therapeutic activity, [id number]- Neuromuscular re-education, 97535- Self Care, 02859- Manual therapy, [id number]- Gait training, [phone]- Canalith repositioning, [id number]- Aquatic Therapy, H9716- Electrical stimulation (unattended), [id number]- Electrical stimulation (manual), [id number]- Vasopneumatic device, [id number]- Ultrasound, [id number]- Traction (mechanical), [id number]- Ionotophoresis 4mg /ml Dexamethasone, 79439 (1-2 muscles), 20561 (3+ muscles)- Dry Needling, Patient/Family education, Balance training, Stair  training, Taping, Joint mobilization, Joint manipulation, Spinal manipulation, Spinal mobilization, Vestibular training, DME instructions, Wheelchair mobility training, Cryotherapy, and Moist heat  PLAN FOR NEXT SESSION: aquatic PT   Jarrell Laming, PT, DPT 06/08/2024, 1:08 PM  [facility] [address] [place], [place] [address] Phone # [phone] Fax [phone]  "

## 2024-06-08 NOTE — Telephone Encounter (Signed)
 Patient called and stated that rheumatology did not get her referral. She provided the fax number and it was the same fax number in the referral. FYI.

## 2024-06-13 ENCOUNTER — Ambulatory Visit: Admitting: Family Medicine

## 2024-06-14 ENCOUNTER — Ambulatory Visit: Admitting: Physical Therapy

## 2024-06-14 ENCOUNTER — Encounter: Payer: Self-pay | Admitting: Physical Therapy

## 2024-06-14 ENCOUNTER — Encounter: Payer: Self-pay | Admitting: Obstetrics and Gynecology

## 2024-06-14 DIAGNOSIS — M47816 Spondylosis without myelopathy or radiculopathy, lumbar region: Secondary | ICD-10-CM | POA: Diagnosis not present

## 2024-06-14 DIAGNOSIS — M6281 Muscle weakness (generalized): Secondary | ICD-10-CM

## 2024-06-14 DIAGNOSIS — R2689 Other abnormalities of gait and mobility: Secondary | ICD-10-CM

## 2024-06-14 DIAGNOSIS — R293 Abnormal posture: Secondary | ICD-10-CM

## 2024-06-14 DIAGNOSIS — M25571 Pain in right ankle and joints of right foot: Secondary | ICD-10-CM

## 2024-06-14 NOTE — Therapy (Signed)
 " OUTPATIENT PHYSICAL THERAPY LOWER EXTREMITY EVALUATION   Patient Name: Kiara Andrews MRN: [id number] DOB:01-Jun-1971, 54 y.o., female Today's Date: 06/14/2024  END OF SESSION:  PT End of Session - 06/14/24 1057     Visit Number 2    Date for Recertification  08/04/24    Authorization Type [employer]    PT Start Time 1015    PT Stop Time 1050    PT Time Calculation (min) 35 min    Activity Tolerance Patient tolerated treatment well    Behavior During Therapy WFL for tasks assessed/performed           Past Medical History:  Diagnosis Date   ADHD    Anxiety    Eating disorder    Obesity    PTSD (post-traumatic stress disorder)    military service   Past Surgical History:  Procedure Laterality Date   bilateral bunion surgery     BREAST REDUCTION SURGERY Bilateral 1994   TONSILLECTOMY  1987   Patient Active Problem List   Diagnosis Date Noted   Seropositive rheumatoid arthritis (HCC) 05/17/2024   Hypermobile Ehlers-Danlos syndrome 05/17/2024   GAD (generalized anxiety disorder) 02/15/2024   Lumbar spondylosis 02/15/2024   OSA (obstructive sleep apnea) 08/14/2020   Iron deficiency anemia 06/04/2020   Class 3 severe obesity due to excess calories without serious comorbidity with body mass index (BMI) of 40.0 to 44.9 in adult (HCC) 02/22/2020   Low HDL (under 40) 02/22/2020   Vitamin D deficiency 03/27/2011    PCP: Sharl Tully HERO, PA-C  REFERRING PROVIDER: Corie Sherlean Caldron, PA-C  REFERRING DIAG: [phone] (ICD-10-CM) - Spondylosis without myelopathy or radiculopathy, lumbar region M25.50 (ICD-10-CM) - Pain in unspecified joint M05.79 (ICD-10-CM) - Rheumatoid arthritis with rheumatoid factor of multiple sites without organ or systems involvement  THERAPY DIAG:  Muscle weakness (generalized)  Abnormal posture  Other abnormalities of gait and mobility  Bilateral ankle pain, unspecified chronicity  Rationale for Evaluation and Treatment:  Rehabilitation  ONSET DATE: at least 2 years  SUBJECTIVE:   SUBJECTIVE STATEMENT: Excited to be at pool and begin some exercise. Her sister is present for [facility] assistance.   PERTINENT HISTORY: RA, EDS, sleep apnea  PAIN:  Are you having pain? Yes: NPRS scale: 6/10 Pain location: general body Pain description: nervy, sharp, aching Aggravating factors: certain movements Relieving factors: pain medication  PRECAUTIONS: Fall  RED FLAGS: None   WEIGHT BEARING RESTRICTIONS: No  FALLS:  Has patient fallen in last 6 months? No falls, but has had some stumbles where she did not fully fall  LIVING ENVIRONMENT: Lives with: lives with their family Lives in: House/apartment Stairs: two story, but primarily on first floor Has following equipment at home: Environmental Consultant - 2 wheeled, Wheelchair (manual), shower chair, Grab bars, and raised toilet seat  OCCUPATION: Disability  PLOF: Requires assistive device for independence, Needs assistance with ADLs, and Leisure: crocheting, sticker books  PATIENT GOALS: To strengthen overall and help decrease risk of re-injuring the joints.  NEXT MD VISIT: February 2026  OBJECTIVE:  Note: Objective measures were completed at Evaluation unless otherwise noted.  DIAGNOSTIC FINDINGS:  Lumbar Radiograph on 03/20/2024: IMPRESSION:  No acute compression deformity. Dextroscoliosis centered at L3. Grade 1 anterolisthesis of L4. Moderate multilevel degenerative disc disease, worst at L5-S1. Moderate to severe lower lumbar spine facet arthropathy.   Left Knee Radiograph on 08/17/2023: FINDINGS: Osteopenia. No acute fracture or dislocation. Moderate joint space narrowing of the medial compartment. Moderate osteophyte formation of the lateral  compartment. Mild osteophyte formation of the patellofemoral compartment. No area of erosion or osseous destruction. No unexpected radiopaque foreign body. Soft tissues are unremarkable.  Right knee Radiograph on  08/17/2023: IMPRESSION: Tricompartmental degenerative changes of the right knee, most pronounced in the medial compartment.     PATIENT SURVEYS:  LEFS  Extreme difficulty/unable (0), Quite a bit of difficulty (1), Moderate difficulty (2), Little difficulty (3), No difficulty (4) Survey date:  06/09/23  Any of your usual work, housework or school activities 0  2. Usual hobbies, recreational or sporting activities 1  3. Getting into/out of the bath 0  4. Walking between rooms 0  5. Putting on socks/shoes 0  6. Squatting  0  7. Lifting an object, like a bag of groceries from the floor 0  8. Performing light activities around your home 1  9. Performing heavy activities around your home 0  10. Getting into/out of a car 0  11. Walking 2 blocks 0  12. Walking 1 mile 0  13. Going up/down 10 stairs (1 flight) 0  14. Standing for 1 hour 0  15.  sitting for 1 hour 1  16. Running on even ground 0  17. Running on uneven ground 0  18. Making sharp turns while running fast 0  19. Hopping  0  20. Rolling over in bed 1  Score total:  4/80 = 5%     COGNITION: Overall cognitive status: Within functional limits for tasks assessed     SENSATION: Patient reports numbness and tingling into hands and sometimes at top of feet and toes  EDEMA:  Reports that she gets ankle swelling  MUSCLE LENGTH: Hamstrings: WFL Patient reports that she has had some hip tightness recently  POSTURE: rounded shoulders and flexed trunk     LOWER EXTREMITY MMT:  Eval: Hip strength grossly 4-/5 throughout Bilateral quad strength of 4/5   FUNCTIONAL TESTS:  Eval: Standing tolerance:  30 sec at barre  GAIT: Distance walked: a couple of steps Assistive device utilized: Environmental Consultant - 2 wheeled Level of assistance: Min A and Mod A Comments: To take a couple of steps for transfers                                                                                                                                 TREATMENT DATE:   06/14/24:Pt arrives for aquatic physical therapy. Treatment took place in 3.5-5.5 feet of water. Water temperature was 91 degrees F. Pt entered and exited the pool via chair lift. -In chair: Bil ankle DF/PF 10x, Knee extensions 10x Bil, Small hip add/abd 10x -Stand to large, white noodle: 3 breaths while she gets used to WB, return to sitting. -Short walk from chair usin white noodle behind chair about 5-10 feet and back to chair. Seated rest before completing this 1x more. -Seated in chair core compresisson with green ball on thigh 5x.   06/08/2024: Discussed role of PT  Reviewed HEP    PATIENT EDUCATION:  Education details: Issued HEP Person educated: Patient Education method: Explanation, Demonstration, and Handouts Education comprehension: verbalized understanding  HOME EXERCISE PROGRAM: Access Code: [email or web address] URL: [email or web address] Date: 06/08/2024 Prepared by: Jarrell Laming  Exercises - Seated Heel Toe Raises  - 2 x daily - 7 x weekly - 1-2 sets - 10 reps - Seated March  - 1-2 x daily - 7 x weekly - 1-2 sets - 10 reps - Seated Long Arc Quad  - 1-2 x daily - 7 x weekly - 1-2 sets - 10 reps - Seated Hip Adduction Isometrics with Ball  - 1-2 x daily - 7 x weekly - 1-2 sets - 10 reps  ASSESSMENT:  CLINICAL IMPRESSION: Pt arrives for aquatic Pt with her typical widespread pain. She has experience with deep water exercise in the past with the assistance of her sister. Her sister will be present to transport her to and from the pool deck. Pt will require a slow progression due to her EDS and RA. She appeared to tolerate todays session well, we will know more next time I see her to see how she did later that afternoon.  OBJECTIVE IMPAIRMENTS: decreased activity tolerance, decreased balance, decreased mobility, difficulty walking, decreased strength, postural dysfunction, obesity, and pain.   ACTIVITY LIMITATIONS: carrying, lifting, bending,  standing, squatting, transfers, bed mobility, and dressing  PARTICIPATION LIMITATIONS: meal prep, cleaning, laundry, and community activity  PERSONAL FACTORS: Past/current experiences, Time since onset of injury/illness/exacerbation, and 3+ comorbidities: RA, EDS, lumbar spondylosis are also affecting patient's functional outcome.   REHAB POTENTIAL: Good  CLINICAL DECISION MAKING: Evolving/moderate complexity  EVALUATION COMPLEXITY: Moderate   GOALS: Goals reviewed with patient? Yes  SHORT TERM GOALS: Target date: 06/30/2024 Patient will be independent with initial HEP. Baseline: Goal status: INITIAL   LONG TERM GOALS: Target date: 08/04/2024  Patient will be independent with advanced HEP to allow for self progression after discharge. Baseline:  Goal status: INITIAL  2.  Patient will be able to stand for 2 minutes without increased pain to allow for completion of ADLs/IADLs. Baseline:  Goal status: INITIAL  3.  Patient will improve LE functional strength to [facility] to allow her to perform various types of transfers independently. Baseline:  Goal status: INITIAL  4.  Patient will increase Lower Extremity Functional Scale to at least 15% to demonstrate improvements in functional status. Baseline: 5% Goal status: INITIAL    PLAN:  PT FREQUENCY: 1-2x/week  PT DURATION: 8 weeks  PLANNED INTERVENTIONS: 97164- PT Re-evaluation, 97750- Physical Performance Testing, 97110-Therapeutic exercises, 97530- Therapeutic activity, 97112- Neuromuscular re-education, 97535- Self Care, 02859- Manual therapy, [phone]- Gait training, [phone]- Canalith repositioning, [id number]- Aquatic Therapy, [id number]- Electrical stimulation (unattended), [phone]- Electrical stimulation (manual), [id number]- Vasopneumatic device, [id number]- Ultrasound, [id number]- Traction (mechanical), [id number]- Ionotophoresis 4mg /ml Dexamethasone, 79439 (1-2 muscles), 20561 (3+ muscles)- Dry Needling, Patient/Family education, Balance training, Stair  training, Taping, Joint mobilization, Joint manipulation, Spinal manipulation, Spinal mobilization, Vestibular training, DME instructions, Wheelchair mobility training, Cryotherapy, and Moist heat  PLAN FOR NEXT SESSION: assess response to aquatic PT  Delon Darner, PTA 06/14/2024 12:40 PM     [facility] [address] [place], [place] [address] Phone # [phone] Fax [phone]  "

## 2024-06-19 ENCOUNTER — Other Ambulatory Visit: Payer: Self-pay | Admitting: Obstetrics and Gynecology

## 2024-06-19 DIAGNOSIS — N951 Menopausal and female climacteric states: Secondary | ICD-10-CM

## 2024-06-19 MED ORDER — ESTRADIOL 0.0375 MG/24HR TD PTTW: 1.0000 | MEDICATED_PATCH | TRANSDERMAL | 12 refills | Status: DC

## 2024-06-20 ENCOUNTER — Ambulatory Visit (HOSPITAL_BASED_OUTPATIENT_CLINIC_OR_DEPARTMENT_OTHER): Admitting: Physical Therapy

## 2024-06-20 ENCOUNTER — Encounter (HOSPITAL_BASED_OUTPATIENT_CLINIC_OR_DEPARTMENT_OTHER): Payer: Self-pay

## 2024-06-21 ENCOUNTER — Ambulatory Visit: Admitting: Physical Therapy

## 2024-06-23 ENCOUNTER — Ambulatory Visit: Admitting: Physical Therapy

## 2024-06-23 ENCOUNTER — Encounter: Payer: Self-pay | Admitting: Physical Therapy

## 2024-06-23 DIAGNOSIS — M47816 Spondylosis without myelopathy or radiculopathy, lumbar region: Secondary | ICD-10-CM | POA: Diagnosis not present

## 2024-06-23 DIAGNOSIS — R2689 Other abnormalities of gait and mobility: Secondary | ICD-10-CM

## 2024-06-23 DIAGNOSIS — M6281 Muscle weakness (generalized): Secondary | ICD-10-CM

## 2024-06-23 DIAGNOSIS — M25571 Pain in right ankle and joints of right foot: Secondary | ICD-10-CM

## 2024-06-23 DIAGNOSIS — R293 Abnormal posture: Secondary | ICD-10-CM

## 2024-06-23 NOTE — Therapy (Signed)
 " OUTPATIENT PHYSICAL THERAPY LOWER EXTREMITY PROGRESS NOTE   Patient Name: Kiara Andrews MRN: [id number] DOB:[birth year], 54 y.o., female Today's Date: 06/23/2024  END OF SESSION:  PT End of Session - 06/23/24 1308     Visit Number 3    Date for Recertification  08/04/24    Authorization Type [place] [employer]    PT Start Time 1309   pt late   PT Stop Time 1347    PT Time Calculation (min) 38 min    Activity Tolerance Patient tolerated treatment well    Behavior During Therapy WFL for tasks assessed/performed            Past Medical History:  Diagnosis Date   ADHD    Anxiety    Eating disorder    Obesity    PTSD (post-traumatic stress disorder)    military service   Past Surgical History:  Procedure Laterality Date   bilateral bunion surgery     BREAST REDUCTION SURGERY Bilateral 1994   TONSILLECTOMY  1987   Patient Active Problem List   Diagnosis Date Noted   Seropositive rheumatoid arthritis (HCC) 05/17/2024   Hypermobile Ehlers-Danlos syndrome 05/17/2024   GAD (generalized anxiety disorder) 02/15/2024   Lumbar spondylosis 02/15/2024   OSA (obstructive sleep apnea) 08/14/2020   Iron deficiency anemia 06/04/2020   Class 3 severe obesity due to excess calories without serious comorbidity with body mass index (BMI) of 40.0 to 44.9 in adult (HCC) 02/22/2020   Low HDL (under 40) 02/22/2020   Vitamin D deficiency 03/27/2011    PCP: Sharl Tully HERO, PA-C  REFERRING PROVIDER: Corie Sherlean Caldron, PA-C  REFERRING DIAG: [phone] (ICD-10-CM) - Spondylosis without myelopathy or radiculopathy, lumbar region M25.50 (ICD-10-CM) - Pain in unspecified joint M05.79 (ICD-10-CM) - Rheumatoid arthritis with rheumatoid factor of multiple sites without organ or systems involvement  THERAPY DIAG:  Muscle weakness (generalized)  Abnormal posture  Other abnormalities of gait and mobility  Bilateral ankle pain, unspecified chronicity  Rationale for Evaluation and  Treatment: Rehabilitation  ONSET DATE: at least 2 years  SUBJECTIVE:   SUBJECTIVE STATEMENT: I felt good after our first session. [facility] of [facility] we did. I got some steroids from my MD so my joints are feeling better and I am sleeping better/deeper.  PERTINENT HISTORY: RA, EDS, sleep apnea  PAIN:  Are you having pain? Yes: NPRS scale: 3-4/10 Pain location: general body Pain description: nervy, sharp, aching Aggravating factors: certain movements Relieving factors: pain medication  PRECAUTIONS: Fall  RED FLAGS: None   WEIGHT BEARING RESTRICTIONS: No  FALLS:  Has patient fallen in last 6 months? No falls, but has had some stumbles where she did not fully fall  LIVING ENVIRONMENT: Lives with: lives with their family Lives in: House/apartment Stairs: two story, but primarily on first floor Has following equipment at home: Environmental Consultant - 2 wheeled, Wheelchair (manual), shower chair, Grab bars, and raised toilet seat  OCCUPATION: Disability  PLOF: Requires assistive device for independence, Needs assistance with ADLs, and Leisure: crocheting, sticker books  PATIENT GOALS: To strengthen overall and help decrease risk of re-injuring the joints.  NEXT MD VISIT: February 2026  OBJECTIVE:  Note: Objective measures were completed at Evaluation unless otherwise noted.  DIAGNOSTIC FINDINGS:  Lumbar Radiograph on 03/20/2024: IMPRESSION:  No acute compression deformity. Dextroscoliosis centered at L3. Grade 1 anterolisthesis of L4. Moderate multilevel degenerative disc disease, worst at L5-S1. Moderate to severe lower  lumbar spine facet arthropathy.   Left Knee Radiograph on 08/17/2023: FINDINGS: Osteopenia. No acute fracture or dislocation. Moderate joint space narrowing of the medial compartment. Moderate osteophyte formation of the lateral compartment. Mild osteophyte formation of the patellofemoral compartment. No area of erosion  or osseous destruction. No unexpected radiopaque foreign body. Soft tissues are unremarkable.  Right knee Radiograph on 08/17/2023: IMPRESSION: Tricompartmental degenerative changes of the right knee, most pronounced in the medial compartment.     PATIENT SURVEYS:  LEFS  Extreme difficulty/unable (0), Quite a bit of difficulty (1), Moderate difficulty (2), Little difficulty (3), No difficulty (4) Survey date:  06/09/23  Any of your usual work, housework or school activities 0  2. Usual hobbies, recreational or sporting activities 1  3. Getting into/out of the bath 0  4. Walking between rooms 0  5. Putting on socks/shoes 0  6. Squatting  0  7. Lifting an object, like a bag of groceries from the floor 0  8. Performing light activities around your home 1  9. Performing heavy activities around your home 0  10. Getting into/out of a car 0  11. Walking 2 blocks 0  12. Walking 1 mile 0  13. Going up/down 10 stairs (1 flight) 0  14. Standing for 1 hour 0  15.  sitting for 1 hour 1  16. Running on even ground 0  17. Running on uneven ground 0  18. Making sharp turns while running fast 0  19. Hopping  0  20. Rolling over in bed 1  Score total:  4/80 = 5%     COGNITION: Overall cognitive status: Within functional limits for tasks assessed     SENSATION: Patient reports numbness and tingling into hands and sometimes at top of feet and toes  EDEMA:  Reports that she gets ankle swelling  MUSCLE LENGTH: Hamstrings: WFL Patient reports that she has had some hip tightness recently  POSTURE: rounded shoulders and flexed trunk     LOWER EXTREMITY MMT:  Eval: Hip strength grossly 4-/5 throughout Bilateral quad strength of 4/5   FUNCTIONAL TESTS:  Eval: Standing tolerance:  30 sec at barre  GAIT: Distance walked: a couple of steps Assistive device utilized: Environmental Consultant - 2 wheeled Level of assistance: Min A and Mod A Comments: To take a couple of steps for transfers                                                                                                                                 TREATMENT DATE:   06/23/24:Pt arrives for aquatic physical therapy. Treatment took place in 3.5-5.5 feet of water. Water temperature was 91 degrees F. Pt entered and exited the pool via chair lift. -In chair: Bil ankle DF/PF 15x, Knee extensions 10x Bil, Small hip add/abd 10x -Stand to large, white noodle: 3 breaths while she gets used to WB, walk to 75% submersion with xtra large noodle. Marching 5x at wall,  back step 5x at wall Bil. -Forward walk 2 lengths with large white noodle, then decompression rest in deep end a few min. -Side stepping 2 lengths with white noodle, rest -In deep end holding white noodle xcountry LE 30 sec -75% depth trunk rotation with noodle 5x Bil   06/14/24:Pt arrives for aquatic physical therapy. Treatment took place in 3.5-5.5 feet of water. Water temperature was 91 degrees F. Pt entered and exited the pool via chair lift. -In chair: Bil ankle DF/PF 10x, Knee extensions 10x Bil, Small hip add/abd 10x -Stand to large, white noodle: 3 breaths while she gets used to WB, return to sitting. -Short walk from chair usin white noodle behind chair about 5-10 feet and back to chair. Seated rest before completing this 1x more. -Seated in chair core compresisson with green ball on thigh 5x.   06/08/2024: Discussed role of PT Reviewed HEP    PATIENT EDUCATION:  Education details: Issued HEP Person educated: Patient Education method: Explanation, Demonstration, and Handouts Education comprehension: verbalized understanding  HOME EXERCISE PROGRAM: Access Code: [email or web address] URL: [email or web address] Date: 06/08/2024 Prepared by: Jarrell Laming  Exercises - Seated Heel Toe Raises  - 2 x daily - 7 x weekly - 1-2 sets - 10 reps - Seated March  - 1-2 x daily - 7 x weekly - 1-2 sets - 10 reps - Seated Long Arc Quad  - 1-2 x daily - 7 x weekly - 1-2  sets - 10 reps - Seated Hip Adduction Isometrics with Ball  - 1-2 x daily - 7 x weekly - 1-2 sets - 10 reps  ASSESSMENT:  CLINICAL IMPRESSION: Pt reporting a very positive response to her first aquatic PT session. We were able to increase her overall workload slightly which pt was very happy about. She does need some verbal reminders to overstretch in some exercises and to gain more control. Some fatigue noted at end of todays session   OBJECTIVE IMPAIRMENTS: decreased activity tolerance, decreased balance, decreased mobility, difficulty walking, decreased strength, postural dysfunction, obesity, and pain.   ACTIVITY LIMITATIONS: carrying, lifting, bending, standing, squatting, transfers, bed mobility, and dressing  PARTICIPATION LIMITATIONS: meal prep, cleaning, laundry, and community activity  PERSONAL FACTORS: Past/current experiences, Time since onset of injury/illness/exacerbation, and 3+ comorbidities: RA, EDS, lumbar spondylosis are also affecting patient's functional outcome.   REHAB POTENTIAL: Good  CLINICAL DECISION MAKING: Evolving/moderate complexity  EVALUATION COMPLEXITY: Moderate   GOALS: Goals reviewed with patient? Yes  SHORT TERM GOALS: Target date: 06/30/2024 Patient will be independent with initial HEP. Baseline: Goal status: INITIAL   LONG TERM GOALS: Target date: 08/04/2024  Patient will be independent with advanced HEP to allow for self progression after discharge. Baseline:  Goal status: INITIAL  2.  Patient will be able to stand for 2 minutes without increased pain to allow for completion of ADLs/IADLs. Baseline:  Goal status: INITIAL  3.  Patient will improve LE functional strength to [facility] to allow her to perform various types of transfers independently. Baseline:  Goal status: INITIAL  4.  Patient will increase Lower Extremity Functional Scale to at least 15% to demonstrate improvements in functional status. Baseline: 5% Goal status:  INITIAL    PLAN:  PT FREQUENCY: 1-2x/week  PT DURATION: 8 weeks  PLANNED INTERVENTIONS: 97164- PT Re-evaluation, 97750- Physical Performance Testing, 97110-Therapeutic exercises, 97530- Therapeutic activity, [id number]- Neuromuscular re-education, 97535- Self Care, 02859- Manual therapy, [id number]- Gait training, [phone]- Canalith repositioning, [id number]- Aquatic Therapy, H9716- Electrical stimulation (unattended), [id number]- Electrical stimulation (manual), [id number]- Vasopneumatic  device, [id number]- Ultrasound, 02987- Traction (mechanical), [id number]- Ionotophoresis 4mg /ml Dexamethasone, 79439 (1-2 muscles), 20561 (3+ muscles)- Dry Needling, Patient/Family education, Balance training, Stair training, Taping, Joint mobilization, Joint manipulation, Spinal manipulation, Spinal mobilization, Vestibular training, DME instructions, Wheelchair mobility training, Cryotherapy, and Moist heat  PLAN FOR NEXT SESSION: assess response to aquatic PT  Delon Darner, PTA 06/23/24 1:48 PM     [facility] [address] [place], [place] [address] Phone # [phone] Fax [phone]  "

## 2024-06-27 ENCOUNTER — Other Ambulatory Visit: Payer: Self-pay

## 2024-06-27 DIAGNOSIS — N951 Menopausal and female climacteric states: Secondary | ICD-10-CM

## 2024-06-27 MED ORDER — ESTRADIOL 0.0375 MG/24HR TD PTTW: 1.0000 | MEDICATED_PATCH | TRANSDERMAL | 12 refills | Status: DC

## 2024-06-27 NOTE — Progress Notes (Signed)
 Patient requested Kiara Andrews  patch be sent to [facility] pharmacy. Received verbal from Dr. Cleatus, electronically sent to pharmacy.  Silvano LELON Piano, RN

## 2024-06-28 ENCOUNTER — Encounter: Payer: Self-pay | Admitting: Physical Therapy

## 2024-06-28 ENCOUNTER — Ambulatory Visit: Admitting: Physical Therapy

## 2024-06-28 DIAGNOSIS — R2689 Other abnormalities of gait and mobility: Secondary | ICD-10-CM

## 2024-06-28 DIAGNOSIS — M25571 Pain in right ankle and joints of right foot: Secondary | ICD-10-CM

## 2024-06-28 DIAGNOSIS — R293 Abnormal posture: Secondary | ICD-10-CM

## 2024-06-28 DIAGNOSIS — M6281 Muscle weakness (generalized): Secondary | ICD-10-CM

## 2024-06-28 DIAGNOSIS — M47816 Spondylosis without myelopathy or radiculopathy, lumbar region: Secondary | ICD-10-CM | POA: Diagnosis not present

## 2024-06-28 NOTE — Therapy (Signed)
 " OUTPATIENT PHYSICAL THERAPY LOWER EXTREMITY PROGRESS NOTE   Patient Name: Kiara Andrews MRN: [id number] DOB:1970-07-31, 54 y.o., female Today's Date: 06/28/2024  END OF SESSION:  PT End of Session - 06/28/24 1012     Visit Number 4    Date for Recertification  08/04/24    Authorization Type [place] [employer]    PT Start Time 1015    PT Stop Time 1101    PT Time Calculation (min) 46 min    Activity Tolerance Patient tolerated treatment well    Behavior During Therapy WFL for tasks assessed/performed             Past Medical History:  Diagnosis Date   ADHD    Anxiety    Eating disorder    Obesity    PTSD (post-traumatic stress disorder)    military service   Past Surgical History:  Procedure Laterality Date   bilateral bunion surgery     BREAST REDUCTION SURGERY Bilateral 1994   TONSILLECTOMY  1987   Patient Active Problem List   Diagnosis Date Noted   Seropositive rheumatoid arthritis (HCC) 05/17/2024   Hypermobile Ehlers-Danlos syndrome 05/17/2024   GAD (generalized anxiety disorder) 02/15/2024   Lumbar spondylosis 02/15/2024   OSA (obstructive sleep apnea) 08/14/2020   Iron deficiency anemia 06/04/2020   Class 3 severe obesity due to excess calories without serious comorbidity with body mass index (BMI) of 40.0 to 44.9 in adult (HCC) 02/22/2020   Low HDL (under 40) 02/22/2020   Vitamin D deficiency 03/27/2011    PCP: Sharl Tully HERO, PA-C  REFERRING PROVIDER: Corie Sherlean Caldron, PA-C  REFERRING DIAG: [phone] (ICD-10-CM) - Spondylosis without myelopathy or radiculopathy, lumbar region M25.50 (ICD-10-CM) - Pain in unspecified joint M05.79 (ICD-10-CM) - Rheumatoid arthritis with rheumatoid factor of multiple sites without organ or systems involvement  THERAPY DIAG:  Muscle weakness (generalized)  Abnormal posture  Other abnormalities of gait and mobility  Bilateral ankle pain, unspecified chronicity  Rationale for Evaluation and Treatment:  Rehabilitation  ONSET DATE: at least 2 years  SUBJECTIVE:   SUBJECTIVE STATEMENT: I am able to add every week new exercises to my [employer] routine. I am thinking the warmer water is better than the colder water. Neck and shoulders sore from her upperbody exercises yesterday.  PERTINENT HISTORY: RA, EDS, sleep apnea  PAIN:  Are you having pain? Yes: NPRS scale: 3-4/10 Pain location: Bil neck. Shoulders, ribs Pain description: nervy, sharp, aching Aggravating factors: certain movements Relieving factors: pain medication  PRECAUTIONS: Fall  RED FLAGS: None   WEIGHT BEARING RESTRICTIONS: No  FALLS:  Has patient fallen in last 6 months? No falls, but has had some stumbles where she did not fully fall  LIVING ENVIRONMENT: Lives with: lives with their family Lives in: House/apartment Stairs: two story, but primarily on first floor Has following equipment at home: Environmental Consultant - 2 wheeled, Wheelchair (manual), shower chair, Grab bars, and raised toilet seat  OCCUPATION: Disability  PLOF: Requires assistive device for independence, Needs assistance with ADLs, and Leisure: crocheting, sticker books  PATIENT GOALS: To strengthen overall and help decrease risk of re-injuring the joints.  NEXT MD VISIT: February 2026  OBJECTIVE:  Note: Objective measures were completed at Evaluation unless otherwise noted.  DIAGNOSTIC FINDINGS:  Lumbar Radiograph on 03/20/2024: IMPRESSION:  No acute compression deformity. Dextroscoliosis centered at L3. Grade 1 anterolisthesis of L4. Moderate multilevel degenerative disc disease, worst at L5-S1. Moderate to severe lower lumbar spine facet arthropathy.   Left Knee Radiograph on  08/17/2023: FINDINGS: Osteopenia. No acute fracture or dislocation. Moderate joint space narrowing of the medial compartment. Moderate osteophyte formation of the lateral compartment. Mild osteophyte formation of the patellofemoral compartment. No area of erosion or osseous  destruction. No unexpected radiopaque foreign body. Soft tissues are unremarkable.  Right knee Radiograph on 08/17/2023: IMPRESSION: Tricompartmental degenerative changes of the right knee, most pronounced in the medial compartment.     PATIENT SURVEYS:  LEFS  Extreme difficulty/unable (0), Quite a bit of difficulty (1), Moderate difficulty (2), Little difficulty (3), No difficulty (4) Survey date:  06/09/23  Any of your usual work, housework or school activities 0  2. Usual hobbies, recreational or sporting activities 1  3. Getting into/out of the bath 0  4. Walking between rooms 0  5. Putting on socks/shoes 0  6. Squatting  0  7. Lifting an object, like a bag of groceries from the floor 0  8. Performing light activities around your home 1  9. Performing heavy activities around your home 0  10. Getting into/out of a car 0  11. Walking 2 blocks 0  12. Walking 1 mile 0  13. Going up/down 10 stairs (1 flight) 0  14. Standing for 1 hour 0  15.  sitting for 1 hour 1  16. Running on even ground 0  17. Running on uneven ground 0  18. Making sharp turns while running fast 0  19. Hopping  0  20. Rolling over in bed 1  Score total:  4/80 = 5%     COGNITION: Overall cognitive status: Within functional limits for tasks assessed     SENSATION: Patient reports numbness and tingling into hands and sometimes at top of feet and toes  EDEMA:  Reports that she gets ankle swelling  MUSCLE LENGTH: Hamstrings: WFL Patient reports that she has had some hip tightness recently  POSTURE: rounded shoulders and flexed trunk     LOWER EXTREMITY MMT:  Eval: Hip strength grossly 4-/5 throughout Bilateral quad strength of 4/5   FUNCTIONAL TESTS:  Eval: Standing tolerance:  30 sec at barre  GAIT: Distance walked: a couple of steps Assistive device utilized: Environmental Consultant - 2 wheeled Level of assistance: Min A and Mod A Comments: To take a couple of steps for transfers                                                                                                                                 TREATMENT DATE:   06/28/24:Pt arrives for aquatic physical therapy. Treatment took place in 3.5-5.5 feet of water. Water temperature was 91 degrees F. Pt entered and exited the pool via chair lift. -In chair: Bil ankle DF/PF 15x, Knee extensions 15x Bil, Small hip add/abd 15x -Stand to large, white noodle: 3 breaths while she gets used to WB, walk to 75% submersion with xtra large noodle.  -At the wall: small hip circumduction 10x Bil -Forward walk 6 lengths with  large white noodle, then decompression rest in deep end a few min. -Side stepping 6 lengths with white noodle, rest -Standing trunk rotation  holding white noodle: AROM 5x, then hold in LT rotation with 2 breaths 2x -Standing against the wall for core press with 1 rainbow float 10x   06/23/24:Pt arrives for aquatic physical therapy. Treatment took place in 3.5-5.5 feet of water. Water temperature was 91 degrees F. Pt entered and exited the pool via chair lift. -In chair: Bil ankle DF/PF 15x, Knee extensions 10x Bil, Small hip add/abd 10x -Stand to large, white noodle: 3 breaths while she gets used to WB, walk to 75% submersion with xtra large noodle. Marching 5x at wall, back step 5x at wall Bil. -Forward walk 2 lengths with large white noodle, then decompression rest in deep end a few min. -Side stepping 2 lengths with white noodle, rest -In deep end holding white noodle xcountry LE 30 sec -75% depth trunk rotation with noodle 5x Bil   06/14/24:Pt arrives for aquatic physical therapy. Treatment took place in 3.5-5.5 feet of water. Water temperature was 91 degrees F. Pt entered and exited the pool via chair lift. -In chair: Bil ankle DF/PF 10x, Knee extensions 10x Bil, Small hip add/abd 10x -Stand to large, white noodle: 3 breaths while she gets used to WB, return to sitting. -Short walk from chair usin white noodle behind chair  about 5-10 feet and back to chair. Seated rest before completing this 1x more. -Seated in chair core compresisson with green ball on thigh 5x.   06/08/2024: Discussed role of PT Reviewed HEP    PATIENT EDUCATION:  Education details: Issued HEP Person educated: Patient Education method: Explanation, Demonstration, and Handouts Education comprehension: verbalized understanding  HOME EXERCISE PROGRAM: Access Code: [email or web address] URL: [email or web address] Date: 06/08/2024 Prepared by: Jarrell Laming  Exercises - Seated Heel Toe Raises  - 2 x daily - 7 x weekly - 1-2 sets - 10 reps - Seated March  - 1-2 x daily - 7 x weekly - 1-2 sets - 10 reps - Seated Long Arc Quad  - 1-2 x daily - 7 x weekly - 1-2 sets - 10 reps - Seated Hip Adduction Isometrics with Ball  - 1-2 x daily - 7 x weekly - 1-2 sets - 10 reps  ASSESSMENT:  CLINICAL IMPRESSION: Pt progressing well with aquatic PT. She is able to take some exercises she learns here into the [facility] on her off days. She has not experienced any exacerbations of pain or excessive fatigue. Pt was able to increase her walking quite a bit in the pool today. Pt does need reminders that less is more and we must be careful to not overdo in the water.   OBJECTIVE IMPAIRMENTS: decreased activity tolerance, decreased balance, decreased mobility, difficulty walking, decreased strength, postural dysfunction, obesity, and pain.   ACTIVITY LIMITATIONS: carrying, lifting, bending, standing, squatting, transfers, bed mobility, and dressing  PARTICIPATION LIMITATIONS: meal prep, cleaning, laundry, and community activity  PERSONAL FACTORS: Past/current experiences, Time since onset of injury/illness/exacerbation, and 3+ comorbidities: RA, EDS, lumbar spondylosis are also affecting patient's functional outcome.   REHAB POTENTIAL: Good  CLINICAL DECISION MAKING: Evolving/moderate complexity  EVALUATION COMPLEXITY: Moderate   GOALS: Goals reviewed  with patient? Yes  SHORT TERM GOALS: Target date: 06/30/2024 Patient will be independent with initial HEP. Baseline: Goal status: INITIAL   LONG TERM GOALS: Target date: 08/04/2024  Patient will be independent with advanced HEP to allow for self progression after discharge. Baseline:  Goal status: INITIAL  2.  Patient will be able to stand for 2 minutes without increased pain to allow for completion of ADLs/IADLs. Baseline:  Goal status: INITIAL  3.  Patient will improve LE functional strength to [facility] to allow her to perform various types of transfers independently. Baseline:  Goal status: INITIAL  4.  Patient will increase Lower Extremity Functional Scale to at least 15% to demonstrate improvements in functional status. Baseline: 5% Goal status: INITIAL    PLAN:  PT FREQUENCY: 1-2x/week  PT DURATION: 8 weeks  PLANNED INTERVENTIONS: 97164- PT Re-evaluation, 97750- Physical Performance Testing, 97110-Therapeutic exercises, 97530- Therapeutic activity, 97112- Neuromuscular re-education, 97535- Self Care, 02859- Manual therapy, [phone]- Gait training, [phone]- Canalith repositioning, [id number]- Aquatic Therapy, [phone]- Electrical stimulation (unattended), [phone]- Electrical stimulation (manual), [id number]- Vasopneumatic device, [id number]- Ultrasound, [id number]- Traction (mechanical), [id number]- Ionotophoresis 4mg /ml Dexamethasone, 20560 (1-2 muscles), 20561 (3+ muscles)- Dry Needling, Patient/Family education, Balance training, Stair training, Taping, Joint mobilization, Joint manipulation, Spinal manipulation, Spinal mobilization, Vestibular training, DME instructions, Wheelchair mobility training, Cryotherapy, and Moist heat  PLAN FOR NEXT SESSION: assess response to aquatic PT  Delon Darner, PTA 06/28/24 11:43 AM     [facility] [address], [address] [place], [place] [address] Phone # [phone] Fax [phone]  "

## 2024-07-05 ENCOUNTER — Other Ambulatory Visit: Payer: Self-pay

## 2024-07-05 ENCOUNTER — Ambulatory Visit: Admitting: Physical Therapy

## 2024-07-05 DIAGNOSIS — M25571 Pain in right ankle and joints of right foot: Secondary | ICD-10-CM

## 2024-07-05 DIAGNOSIS — M6281 Muscle weakness (generalized): Secondary | ICD-10-CM

## 2024-07-05 DIAGNOSIS — N951 Menopausal and female climacteric states: Secondary | ICD-10-CM

## 2024-07-05 DIAGNOSIS — R2689 Other abnormalities of gait and mobility: Secondary | ICD-10-CM

## 2024-07-05 DIAGNOSIS — R293 Abnormal posture: Secondary | ICD-10-CM

## 2024-07-05 MED ORDER — ESTRADIOL 0.0375 MG/24HR TD PTTW: 1.0000 | MEDICATED_PATCH | TRANSDERMAL | 4 refills | Status: AC

## 2024-07-05 NOTE — Therapy (Signed)
 " OUTPATIENT PHYSICAL THERAPY LOWER EXTREMITY PROGRESS NOTE   Patient Name: Kiara Andrews MRN: [id number] DOB:[birth year], 54 y.o., female Today's Date: 07/05/2024  END OF SESSION:  PT End of Session - 07/05/24 1018     Visit Number 5    Date for Recertification  08/04/24    Authorization Type [employer]    PT Start Time 1017    PT Stop Time 1100    PT Time Calculation (min) 43 min    Activity Tolerance Patient tolerated treatment well    Behavior During Therapy Restless              Past Medical History:  Diagnosis Date   ADHD    Anxiety    Eating disorder    Obesity    PTSD (post-traumatic stress disorder)    military service   Past Surgical History:  Procedure Laterality Date   bilateral bunion surgery     BREAST REDUCTION SURGERY Bilateral 1994   TONSILLECTOMY  1987   Patient Active Problem List   Diagnosis Date Noted   Seropositive rheumatoid arthritis (HCC) 05/17/2024   Hypermobile Ehlers-Danlos syndrome 05/17/2024   GAD (generalized anxiety disorder) 02/15/2024   Lumbar spondylosis 02/15/2024   OSA (obstructive sleep apnea) 08/14/2020   Iron deficiency anemia 06/04/2020   Class 3 severe obesity due to excess calories without serious comorbidity with body mass index (BMI) of 40.0 to 44.9 in adult (HCC) 02/22/2020   Low HDL (under 40) 02/22/2020   Vitamin D deficiency 03/27/2011    PCP: Sharl Tully HERO, PA-C  REFERRING PROVIDER: Corie Sherlean Caldron, PA-C  REFERRING DIAG: [phone] (ICD-10-CM) - Spondylosis without myelopathy or radiculopathy, lumbar region M25.50 (ICD-10-CM) - Pain in unspecified joint M05.79 (ICD-10-CM) - Rheumatoid arthritis with rheumatoid factor of multiple sites without organ or systems involvement  THERAPY DIAG:  Muscle weakness (generalized)  Abnormal posture  Other abnormalities of gait and mobility  Bilateral ankle pain, unspecified chronicity  Rationale for Evaluation and Treatment: Rehabilitation  ONSET  DATE: at least 2 years  SUBJECTIVE:   SUBJECTIVE STATEMENT: [facility] like [facility]. My hips and ankles were a bit sore last night but not too bad.   PERTINENT HISTORY: RA, EDS, sleep apnea  PAIN:  Are you having pain? Yes: NPRS scale: 3-4/10 Pain location: Ankles, hips Pain description: joint soreness Aggravating factors: certain movements Relieving factors: pain medication  PRECAUTIONS: Fall  RED FLAGS: None   WEIGHT BEARING RESTRICTIONS: No  FALLS:  Has patient fallen in last 6 months? No falls, but has had some stumbles where she did not fully fall  LIVING ENVIRONMENT: Lives with: lives with their family Lives in: House/apartment Stairs: two story, but primarily on first floor Has following equipment at home: Environmental Consultant - 2 wheeled, Wheelchair (manual), shower chair, Grab bars, and raised toilet seat  OCCUPATION: Disability  PLOF: Requires assistive device for independence, Needs assistance with ADLs, and Leisure: crocheting, sticker books  PATIENT GOALS: To strengthen overall and help decrease risk of re-injuring the joints.  NEXT MD VISIT: February 2026  OBJECTIVE:  Note: Objective measures were completed at Evaluation unless otherwise noted.  DIAGNOSTIC FINDINGS:  Lumbar Radiograph on 03/20/2024: IMPRESSION:  No acute compression deformity. Dextroscoliosis centered at L3. Grade 1 anterolisthesis of L4. Moderate multilevel degenerative disc disease, worst at L5-S1. Moderate to severe lower lumbar spine facet arthropathy.   Left Knee Radiograph on 08/17/2023: FINDINGS: Osteopenia. No acute fracture or dislocation. Moderate joint space  narrowing of the medial compartment. Moderate osteophyte formation of the lateral compartment. Mild osteophyte formation of the patellofemoral compartment. No area of erosion or osseous destruction. No unexpected radiopaque foreign body. Soft tissues are unremarkable.  Right knee Radiograph on  08/17/2023: IMPRESSION: Tricompartmental degenerative changes of the right knee, most pronounced in the medial compartment.     PATIENT SURVEYS:  LEFS  Extreme difficulty/unable (0), Quite a bit of difficulty (1), Moderate difficulty (2), Little difficulty (3), No difficulty (4) Survey date:  06/09/23  Any of your usual work, housework or school activities 0  2. Usual hobbies, recreational or sporting activities 1  3. Getting into/out of the bath 0  4. Walking between rooms 0  5. Putting on socks/shoes 0  6. Squatting  0  7. Lifting an object, like a bag of groceries from the floor 0  8. Performing light activities around your home 1  9. Performing heavy activities around your home 0  10. Getting into/out of a car 0  11. Walking 2 blocks 0  12. Walking 1 mile 0  13. Going up/down 10 stairs (1 flight) 0  14. Standing for 1 hour 0  15.  sitting for 1 hour 1  16. Running on even ground 0  17. Running on uneven ground 0  18. Making sharp turns while running fast 0  19. Hopping  0  20. Rolling over in bed 1  Score total:  4/80 = 5%     COGNITION: Overall cognitive status: Within functional limits for tasks assessed     SENSATION: Patient reports numbness and tingling into hands and sometimes at top of feet and toes  EDEMA:  Reports that she gets ankle swelling  MUSCLE LENGTH: Hamstrings: WFL Patient reports that she has had some hip tightness recently  POSTURE: rounded shoulders and flexed trunk     LOWER EXTREMITY MMT:  Eval: Hip strength grossly 4-/5 throughout Bilateral quad strength of 4/5   FUNCTIONAL TESTS:  Eval: Standing tolerance:  30 sec at barre  GAIT: Distance walked: a couple of steps Assistive device utilized: Environmental Consultant - 2 wheeled Level of assistance: Min A and Mod A Comments: To take a couple of steps for transfers                                                                                                                                 TREATMENT DATE:   07/05/24:Pt arrives for aquatic physical therapy. Treatment took place in 3.5-5.5 feet of water. Water temperature was 91 degrees F. Pt entered and exited the pool via chair lift. -In chair: Bil ankle DF/PF 20x, Knee extensions 20x Bil,hip add/abd 20x -Stand to blue hand hold floats: 3 breaths while she gets used to WB, walk to 75% submersion. -At the wall: small hip circumduction 10x Bil -Forward walk 6 lengths with hand floats, then decompression rest in deep end a few min then 6 lengths backwards holding blue  hand floats -Side stepping 6 lengths with blue hand floats, rest -Standing marching off wall holding blue hand floats 10x Bil -Standing against the wall for core press with 1 rainbow float 10x -Standing hip ER at 70 degrees hip flexion 10x Bil   06/28/24:Pt arrives for aquatic physical therapy. Treatment took place in 3.5-5.5 feet of water. Water temperature was 91 degrees F. Pt entered and exited the pool via chair lift. -In chair: Bil ankle DF/PF 15x, Knee extensions 15x Bil, Small hip add/abd 15x -Stand to large, white noodle: 3 breaths while she gets used to WB, walk to 75% submersion with xtra large noodle.  -At the wall: small hip circumduction 10x Bil -Forward walk 6 lengths with large white noodle, then decompression rest in deep end a few min. -Side stepping 6 lengths with white noodle, rest -Standing trunk rotation  holding white noodle: AROM 5x, then hold in LT rotation with 2 breaths 2x -Standing against the wall for core press with 1 rainbow float 10x   06/23/24:Pt arrives for aquatic physical therapy. Treatment took place in 3.5-5.5 feet of water. Water temperature was 91 degrees F. Pt entered and exited the pool via chair lift. -In chair: Bil ankle DF/PF 15x, Knee extensions 10x Bil, Small hip add/abd 10x -Stand to large, white noodle: 3 breaths while she gets used to WB, walk to 75% submersion with xtra large noodle. Marching 5x at wall, back step 5x  at wall Bil. -Forward walk 2 lengths with large white noodle, then decompression rest in deep end a few min. -Side stepping 2 lengths with white noodle, rest -In deep end holding white noodle [place] LE 30 sec -75% depth trunk rotation with noodle 5x Bil    PATIENT EDUCATION:  Education details: Issued HEP Person educated: Patient Education method: Explanation, Facilities Manager, and Handouts Education comprehension: verbalized understanding  HOME EXERCISE PROGRAM: Access Code: [email or web address] URL: [email or web address] Date: 06/08/2024 Prepared by: Jarrell Laming  Exercises - Seated Heel Toe Raises  - 2 x daily - 7 x weekly - 1-2 sets - 10 reps - Seated March  - 1-2 x daily - 7 x weekly - 1-2 sets - 10 reps - Seated Long Arc Quad  - 1-2 x daily - 7 x weekly - 1-2 sets - 10 reps - Seated Hip Adduction Isometrics with Ball  - 1-2 x daily - 7 x weekly - 1-2 sets - 10 reps  ASSESSMENT:  CLINICAL IMPRESSION: Added backwards today which pt really liked the feeling of stretching the front of her hips out. Also added controlled hip ER in a out 70 degrees hip flexion. Pt demonstrates good control of her pelvis. Continues to require short rest breaks as needed typically when pain in her joints increase. Pt has steadily increased her workload and was able to push her WC 10 feet at the conclusion of her treatment.  OBJECTIVE IMPAIRMENTS: decreased activity tolerance, decreased balance, decreased mobility, difficulty walking, decreased strength, postural dysfunction, obesity, and pain.   ACTIVITY LIMITATIONS: carrying, lifting, bending, standing, squatting, transfers, bed mobility, and dressing  PARTICIPATION LIMITATIONS: meal prep, cleaning, laundry, and community activity  PERSONAL FACTORS: Past/current experiences, Time since onset of injury/illness/exacerbation, and 3+ comorbidities: RA, EDS, lumbar spondylosis are also affecting patient's functional outcome.   REHAB POTENTIAL:  Good  CLINICAL DECISION MAKING: Evolving/moderate complexity  EVALUATION COMPLEXITY: Moderate   GOALS: Goals reviewed with patient? Yes  SHORT TERM GOALS: Target date: 06/30/2024 Patient will be independent with initial HEP. Baseline: Goal status: INITIAL  LONG TERM GOALS: Target date: 08/04/2024  Patient will be independent with advanced HEP to allow for self progression after discharge. Baseline:  Goal status: INITIAL  2.  Patient will be able to stand for 2 minutes without increased pain to allow for completion of ADLs/IADLs. Baseline:  Goal status: INITIAL  3.  Patient will improve LE functional strength to [facility] to allow her to perform various types of transfers independently. Baseline:  Goal status: INITIAL  4.  Patient will increase Lower Extremity Functional Scale to at least 15% to demonstrate improvements in functional status. Baseline: 5% Goal status: INITIAL    PLAN:  PT FREQUENCY: 1-2x/week  PT DURATION: 8 weeks  PLANNED INTERVENTIONS: 97164- PT Re-evaluation, 97750- Physical Performance Testing, 97110-Therapeutic exercises, 97530- Therapeutic activity, 97112- Neuromuscular re-education, 97535- Self Care, 02859- Manual therapy, [phone]- Gait training, [phone]- Canalith repositioning, [id number]- Aquatic Therapy, [phone]- Electrical stimulation (unattended), [phone]- Electrical stimulation (manual), [id number]- Vasopneumatic device, [id number]- Ultrasound, [id number]- Traction (mechanical), [id number]- Ionotophoresis 4mg /ml Dexamethasone, 20560 (1-2 muscles), 20561 (3+ muscles)- Dry Needling, Patient/Family education, Balance training, Stair training, Taping, Joint mobilization, Joint manipulation, Spinal manipulation, Spinal mobilization, Vestibular training, DME instructions, Wheelchair mobility training, Cryotherapy, and Moist heat  PLAN FOR NEXT SESSION: assess response to aquatic PT  Delon Darner, PTA 07/05/24 10:58 AM     [facility] [address]  [address] [place], [place] [address] Phone # [phone] Fax [phone]  "

## 2024-07-05 NOTE — Progress Notes (Signed)
 Patient requested larger dispense amount and per Dr. Cleatus this is fine.

## 2024-07-06 ENCOUNTER — Ambulatory Visit (HOSPITAL_BASED_OUTPATIENT_CLINIC_OR_DEPARTMENT_OTHER): Admitting: Physical Therapy

## 2024-07-06 NOTE — Progress Notes (Unsigned)
"       ° °  LILLETTE Ileana Collet, PhD, LAT, ATC acting as a scribe for Artist Lloyd, MD.  Kiara Andrews is a 54 y.o. 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 Pt was last seen by Dr. Lloyd on 06/07/24 and the ganglion cyst on her R 3rd MCP was injected and advised to f/u w/ rheumatology.  Today, pt reports ***  Pertinent review of systems: ***  Relevant historical information: ***   Exam:  There were no vitals taken for this visit. General: Well Developed, well nourished, and in no acute distress.   MSK: ***    Lab and Radiology Results No results found for this or any previous visit (from the past 72 hours). No results found.     Assessment and Plan: 54 y.o. female with ***   PDMP not reviewed this encounter. No orders of the defined types were placed in this encounter.  No orders of the defined types were placed in this encounter.    Discussed warning signs or symptoms. Please see discharge instructions. Patient expresses understanding.   ***  "

## 2024-07-10 ENCOUNTER — Ambulatory Visit: Admitting: Family Medicine

## 2024-07-11 ENCOUNTER — Ambulatory Visit: Admitting: Family Medicine

## 2024-07-11 ENCOUNTER — Ambulatory Visit (HOSPITAL_BASED_OUTPATIENT_CLINIC_OR_DEPARTMENT_OTHER): Admitting: Physical Therapy

## 2024-07-12 ENCOUNTER — Ambulatory Visit: Admitting: Physical Therapy

## 2024-07-18 ENCOUNTER — Ambulatory Visit (HOSPITAL_BASED_OUTPATIENT_CLINIC_OR_DEPARTMENT_OTHER): Admitting: Physical Therapy

## 2024-07-19 ENCOUNTER — Ambulatory Visit: Admitting: Physical Therapy

## 2024-07-25 ENCOUNTER — Ambulatory Visit (HOSPITAL_BASED_OUTPATIENT_CLINIC_OR_DEPARTMENT_OTHER): Admitting: Physical Therapy

## 2024-07-26 ENCOUNTER — Ambulatory Visit: Admitting: Physical Therapy

## 2024-08-04 ENCOUNTER — Ambulatory Visit: Admitting: Rehabilitative and Restorative Service Providers"
